# Patient Record
Sex: Female | Born: 1950
Health system: Southern US, Community
[De-identification: ages and names within clinical notes are randomized; demographics above are authoritative.]

## PROBLEM LIST (undated history)

## (undated) DIAGNOSIS — E785 Hyperlipidemia, unspecified: Secondary | ICD-10-CM

## (undated) DIAGNOSIS — M8580 Other specified disorders of bone density and structure, unspecified site: Secondary | ICD-10-CM

## (undated) HISTORY — DX: Other specified disorders of bone density and structure, unspecified site: M85.80

## (undated) HISTORY — PX: PATELLA FRACTURE SURGERY: SHX735

## (undated) HISTORY — PX: OTHER SURGICAL HISTORY: SHX169

## (undated) HISTORY — DX: Hyperlipidemia, unspecified: E78.5

---

## 1998-02-22 ENCOUNTER — Other Ambulatory Visit: Admission: RE | Admit: 1998-02-22 | Discharge: 1998-02-22 | Payer: Self-pay | Admitting: Obstetrics and Gynecology

## 1999-09-02 ENCOUNTER — Other Ambulatory Visit: Admission: RE | Admit: 1999-09-02 | Discharge: 1999-09-02 | Payer: Self-pay | Admitting: Obstetrics and Gynecology

## 2001-01-19 ENCOUNTER — Other Ambulatory Visit: Admission: RE | Admit: 2001-01-19 | Discharge: 2001-01-19 | Payer: Self-pay | Admitting: Obstetrics and Gynecology

## 2002-04-03 ENCOUNTER — Ambulatory Visit (HOSPITAL_COMMUNITY): Admission: RE | Admit: 2002-04-03 | Discharge: 2002-04-03 | Payer: Self-pay | Admitting: Orthopedic Surgery

## 2002-04-03 ENCOUNTER — Encounter: Payer: Self-pay | Admitting: Orthopedic Surgery

## 2002-06-21 ENCOUNTER — Other Ambulatory Visit: Admission: RE | Admit: 2002-06-21 | Discharge: 2002-06-21 | Payer: Self-pay | Admitting: Obstetrics and Gynecology

## 2003-02-28 ENCOUNTER — Encounter: Admission: RE | Admit: 2003-02-28 | Discharge: 2003-02-28 | Payer: Self-pay | Admitting: Sports Medicine

## 2005-05-09 ENCOUNTER — Other Ambulatory Visit: Admission: RE | Admit: 2005-05-09 | Discharge: 2005-05-09 | Payer: Self-pay | Admitting: Obstetrics and Gynecology

## 2007-08-09 ENCOUNTER — Ambulatory Visit: Payer: Self-pay | Admitting: Cardiology

## 2009-01-10 ENCOUNTER — Ambulatory Visit: Payer: Self-pay | Admitting: Family Medicine

## 2009-01-18 ENCOUNTER — Ambulatory Visit: Payer: Self-pay | Admitting: Family Medicine

## 2009-02-19 ENCOUNTER — Ambulatory Visit: Payer: Self-pay | Admitting: Family Medicine

## 2009-02-26 ENCOUNTER — Ambulatory Visit: Payer: Self-pay | Admitting: Family Medicine

## 2010-09-10 NOTE — Assessment & Plan Note (Signed)
[facility]                            CARDIOLOGY OFFICE NOTE   NAME:[name], [name] BULLUCK                        MRN:          [id number]  DATE:08/09/2007                            DOB:          04-23-1951    I was asked by Dr. Arelia Sneddon to consult on [name] [name] with her family  history of coronary artery disease.   HISTORY OF PRESENT ILLNESS:  She is 60 years of age, married and has  three children.  She is extremely health conscious and has been an  active outdoor enthusiast for years.  She enjoys walking, running,  biking, and weight training.  She used to backpack and camp a lot.  She  works out about 8 hours a week.   She has a family history with her father dying of an MI at age 50.  He  was a smoker and also an alcoholic.  Her mother has  hypercholesterolemia, hypertension and had her first coronary event in  her 18s.   She does not smoke, does not use any recreational products.  She drinks  socially but rarely, and her last cholesterol was about 160.   She is totally asymptomatic.   PAST MEDICAL HISTORY:  She has no known drug allergies.   CURRENT MEDICATIONS:  1. Multivitamin.  2. Aspirin 325 mg a day.  3. Xalatan eye drops daily.   PAST SURGICAL HISTORY:  Negative.   FAMILY HISTORY:  Is as above.   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   REVIEW OF SYSTEMS:  Other than some history of mild depression, is  negative.   PHYSICAL EXAMINATION:  GENERAL:  She is a very healthy-appearing white  female in no acute distress.  VITAL SIGNS:  She is 5 feet 3 inches, weighs 128 pounds.  Blood pressure  is 138/80. Her pulse is 55 and regular.  HEENT:  Normocephalic, atraumatic.  PERRLA.  Extraocular movements  intact.  Sclera clear.  Face symmetry is normal.  Dentition  satisfactory.  NECK:  Supple.  Carotids upstrokes equal bilaterally without bruits, no  JVD.  Thyroid is not  enlarged.  Trachea is midline.  LUNGS:  Clear.  HEART:  Reveals a nondisplaced PMI.  She has normal S1-S2 without  murmur, rub or gallop.  ABDOMEN:  Soft, good bowel sounds.  No midline bruit.  EXTREMITIES:  No cyanosis, clubbing or edema.  Pulses are intact.  NEUROLOGIC:  Exam is intact.  SKIN:  Unremarkable.   Electrocardiogram shows sinus bradycardia, otherwise negative.   ASSESSMENT:  [name] [name] is an extremely health-conscious white female  with only one risk factor, and that is advancing age.  She truly does  not have a family history seeing that her father had other risk factors  as did her mother.  Her mother also was in her 54s when she had her  first coronary event.   I have reinforced all her good healthy activities including the  exercise.  She is going to have lipids drawn by Dr. Arelia Sneddon this week.  We went over acceptable values which I think she will certainly fall  into.   I am concerned about her borderline blood pressure.  I checked it again  with a large adult cuff in the left arm before she left.  It was about  136/about 82.   I have advised her to obtain a blood pressure cuff and keep a close  watch on this.  If she is running consistently above 135-140 over 85-90,  she needs pharmacological therapy.  She is already very careful with her  diet, avoids salt, an exercises on a regular basis.  If her blood  pressure remains high, she most likely has essential hypertension and  would need pharmacological therapy.   I gave her our number to call back if this turns out to be the case.  We  would be more than happy to see her back and to implement therapy.     Thomas C. Daleen Squibb, MD, [facility]  Electronically Signed    TCW/MedQ  DD: 08/09/2007  DT: 08/09/2007  Job #: [id number]   cc:   Juluis Mire, M.D.

## 2011-03-06 ENCOUNTER — Encounter: Payer: Self-pay | Admitting: Family Medicine

## 2011-03-10 ENCOUNTER — Encounter: Payer: Self-pay | Admitting: Family Medicine

## 2011-03-10 ENCOUNTER — Institutional Professional Consult (permissible substitution) (INDEPENDENT_AMBULATORY_CARE_PROVIDER_SITE_OTHER): Payer: Managed Care, Other (non HMO) | Admitting: Family Medicine

## 2011-03-10 ENCOUNTER — Ambulatory Visit (INDEPENDENT_AMBULATORY_CARE_PROVIDER_SITE_OTHER): Payer: Managed Care, Other (non HMO) | Admitting: Family Medicine

## 2011-03-10 VITALS — BP 130/84 | HR 62 | Wt 129.0 lb

## 2011-03-10 DIAGNOSIS — E785 Hyperlipidemia, unspecified: Secondary | ICD-10-CM

## 2011-03-10 DIAGNOSIS — Z23 Encounter for immunization: Secondary | ICD-10-CM

## 2011-03-10 DIAGNOSIS — Z8249 Family history of ischemic heart disease and other diseases of the circulatory system: Secondary | ICD-10-CM

## 2011-03-10 MED ORDER — ATORVASTATIN CALCIUM 10 MG PO TABS: 10.0000 mg | ORAL_TABLET | Freq: Every day | ORAL | Status: DC

## 2011-03-10 NOTE — Patient Instructions (Signed)
Come back after you've been on the Lipitor for a couple of months so we can check your numbers again. She had trouble with aches and pains call me

## 2011-03-10 NOTE — Progress Notes (Signed)
  Subjective:    Patient ID: [name] [name], female    DOB: 04/27/1951, 60 y.o.   MRN: [id number]  HPI She is here for consult concerning recent blood work. The blood work was reviewed with her and did show slightly elevated cholesterol as well as LDL however her HDL was above 90. Resting blood work looked normal. She does have a family history of heart disease with a father having an MI at age 5 and mother apparently dying around age 18 from related problems. She apparently did see a cardiologist approximately 4 years ago however no stress testing was done.   Review of Systems     Objective:   Physical Exam Alert and in no distress otherwise not examined       Assessment & Plan:   1. Hyperlipidemia LDL goal < 100   2. Family history of heart disease in female family member before age 17    I discussed heart disease and the risk factors involved in it. Recommend she start on a statin drug due to her positive family. I discussed possible side effects with her. Also discussed returning to cardiologist probably next year. She will return here after she has been on the statin for 2 months. She will probably started in January when she starts a new job. history.

## 2011-03-28 ENCOUNTER — Ambulatory Visit (INDEPENDENT_AMBULATORY_CARE_PROVIDER_SITE_OTHER): Payer: Managed Care, Other (non HMO) | Admitting: Medical

## 2011-03-28 ENCOUNTER — Encounter: Payer: Self-pay | Admitting: Medical

## 2011-03-28 VITALS — BP 130/80 | HR 60 | Temp 98.4°F | Resp 16 | Wt 126.5 lb

## 2011-03-28 DIAGNOSIS — J329 Chronic sinusitis, unspecified: Secondary | ICD-10-CM

## 2011-03-28 MED ORDER — AMOXICILLIN-POT CLAVULANATE 875-125 MG PO TABS: 1.0000 | ORAL_TABLET | Freq: Two times a day (BID) | ORAL | Status: AC

## 2011-03-28 NOTE — Progress Notes (Signed)
Subjective:   HPI  ZI SEK is a 60 y.o. female who presents with 1 week hx/o cough, sinus pressure, coughing up sticky yellow globules of mucous, headache, chest hurting from coughing so much.   She notes low grade fever earlier in the week, some sore throat, post nasal drainage, ear pressure.  Using OTC Mucinex DM and Delsym.  Husband has been sick with similar.  No other aggravating or relieving factors.    No other c/o.  The following portions of the patient's history were reviewed and updated as appropriate: allergies, current medications, past family history, past medical history, past social history, past surgical history and problem list.  Past Medical History  Diagnosis Date  . Glaucoma   . Hyperlipidemia    Review of Systems Constitutional: +fever, -chills, -sweats, -unexpected -weight change,+fatigue ENT: -runny nose, +ear pain, +sore throat Cardiology:  +chest pain, -palpitations, -edema Respiratory: +cough, -shortness of breath, -wheezing Gastroenterology: -abdominal pain, -nausea, -vomiting, -diarrhea, -constipation Hematology: -bleeding or bruising problems Musculoskeletal: -arthralgias, -myalgias, -joint swelling, -back pain Ophthalmology: -vision changes Urology: -dysuria, -difficulty urinating, -hematuria, -urinary frequency, -urgency Neurology: +headache, -weakness, -tingling, -numbness   Objective:   Filed Vitals:   03/28/11 0817  BP: 130/80  Pulse: 60  Temp: 98.4 F (36.9 C)  Resp: 16    General appearance: Alert, WD/WN, no distress                             Skin: warm, no rash                           Head: +frontal sinus tenderness,                            Eyes: conjunctiva normal, corneas clear, PERRLA                            Ears: pearly TMs, external ear canals normal                          Nose: septum midline, turbinates swollen, with erythema and clear discharge             Mouth/throat: MMM, tongue normal, mild pharyngeal  erythema                           Neck: supple, no adenopathy, no thyromegaly, nontender                          Heart: RRR, normal S1, S2, no murmurs                         Lungs: CTA bilaterally, no wheezes, rales, or rhonchi      Assessment and Plan:   Encounter Diagnosis  Name Primary?  . Sinusitis Yes    Prescription given for Augmentin.  Can use OTC Mucinex DM for congestion.  Tylenol or Ibuprofen OTC for fever and malaise.  Discussed symptomatic relief, nasal saline, and call or return if worse or not improving in 2-3 days.

## 2011-03-28 NOTE — Patient Instructions (Signed)

## 2013-03-10 ENCOUNTER — Ambulatory Visit (INDEPENDENT_AMBULATORY_CARE_PROVIDER_SITE_OTHER): Payer: 59 | Admitting: Family Medicine

## 2013-03-10 ENCOUNTER — Encounter: Payer: Self-pay | Admitting: Family Medicine

## 2013-03-10 VITALS — BP 120/80 | HR 60 | Resp 16 | Wt 125.0 lb

## 2013-03-10 DIAGNOSIS — Z23 Encounter for immunization: Secondary | ICD-10-CM

## 2013-03-10 DIAGNOSIS — K219 Gastro-esophageal reflux disease without esophagitis: Secondary | ICD-10-CM

## 2013-03-10 NOTE — Progress Notes (Signed)
Teaching Physician: Sharlot Gowda, MD Dictated By: Judithann Graves  Subjective:  [name] [name] is a 62 y.o. female who presents for evaluation of 2 weeks of increasing epigastric burning pain and early satiety. She notices no particular foods that incite her epigastric pain, noting only that she feels its onset nearly immediately after eating. She has not been able to eat the same quantity of food that she usually does with meals. She is unaware of any associated weight loss. She has tried over the counter antacids and they have brought some relief. She has no cough and the epigastric pain is not worsened by lying down or consuming a meal near bedtime. She does not have dysphagia and she has had no melena, no nausea, and no emesis.  She has a past history of ulcer disease  ROS as in subjective.  PMH: Past Medical History  Diagnosis Date  . Glaucoma   . Hyperlipidemia    PSH: No past surgical history on file.  Medications: Prior to Admission medications   Medication Sig Start Date End Date Taking? Authorizing Provider  latanoprost (XALATAN) 0.005 % ophthalmic solution 1 drop at bedtime.     Yes Historical Provider, MD  atorvastatin (LIPITOR) 10 MG tablet Take 1 tablet (10 mg total) by mouth daily. 03/10/11 03/09/12  Ronnald Nian, MD    Objective: alert and in no distress. Tympanic membranes and canals are normal. Throat is clear. Tonsils are normal. Neck is supple without adenopathy or thyromegaly. Cardiac exam shows a regular sinus rhythm without murmurs or gallops. Lungs are clear to auscultation. Abdominal exam shows no hepatosplenomegaly, masses or tenderness.  Filed Vitals:   03/10/13 0859  BP: 120/80  Pulse: 60  Resp: 16    Physical Exam:  BP 120/80  Pulse 60  Resp 16  Wt 125 lb (56.7 kg)   Assessment and Plan:  1. GERD (gastroesophageal reflux disease): Recommend trial of over the counter omeprazole 40 mg for 1 week. RTC if therapeutic failure and will consider  alternative proton pump inhibitor vs. Gastroenterology referral for evaluation of PPI-refractory GERD. She will also be referred to gastroenterology for routine colonoscopy  2. Need for prophylactic vaccination and inoculation against unspecified single disease: Given prescription for Zostavax.   Dr. Susann Givens was present for the encounter and agrees with the above assessment and plan.

## 2013-03-10 NOTE — Patient Instructions (Signed)
Gastroesophageal Reflux Disease, Adult Gastroesophageal reflux disease (GERD) happens when acid from your stomach flows up into the esophagus. When acid comes in contact with the esophagus, the acid causes soreness (inflammation) in the esophagus. Over time, GERD may create small holes (ulcers) in the lining of the esophagus. CAUSES   Increased body weight. This puts pressure on the stomach, making acid rise from the stomach into the esophagus.  Smoking. This increases acid production in the stomach.  Drinking alcohol. This causes decreased pressure in the lower esophageal sphincter (valve or ring of muscle between the esophagus and stomach), allowing acid from the stomach into the esophagus.  Late evening meals and a full stomach. This increases pressure and acid production in the stomach.  A malformed lower esophageal sphincter. Sometimes, no cause is found. SYMPTOMS   Burning pain in the lower part of the mid-chest behind the breastbone and in the mid-stomach area. This may occur twice a week or more often.  Trouble swallowing.  Sore throat.  Dry cough.  Asthma-like symptoms including chest tightness, shortness of breath, or wheezing. DIAGNOSIS  Your caregiver may be able to diagnose GERD based on your symptoms. In some cases, X-rays and other tests may be done to check for complications or to check the condition of your stomach and esophagus. TREATMENT  Your caregiver may recommend over-the-counter or prescription medicines to help decrease acid production. Ask your caregiver before starting or adding any new medicines.  HOME CARE INSTRUCTIONS   Change the factors that you can control. Ask your caregiver for guidance concerning weight loss, quitting smoking, and alcohol consumption.  Avoid foods and drinks that make your symptoms worse, such as:  Caffeine or alcoholic drinks.  Chocolate.  Peppermint or mint flavorings.  Garlic and onions.  Spicy foods.  Citrus fruits,  such as oranges, lemons, or limes.  Tomato-based foods such as sauce, chili, salsa, and pizza.  Fried and fatty foods.  Avoid lying down for the 3 hours prior to your bedtime or prior to taking a nap.  Eat small, frequent meals instead of large meals.  Wear loose-fitting clothing. Do not wear anything tight around your waist that causes pressure on your stomach.  Raise the head of your bed 6 to 8 inches with wood blocks to help you sleep. Extra pillows will not help.  Only take over-the-counter or prescription medicines for pain, discomfort, or fever as directed by your caregiver.  Do not take aspirin, ibuprofen, or other nonsteroidal anti-inflammatory drugs (NSAIDs). SEEK IMMEDIATE MEDICAL CARE IF:   You have pain in your arms, neck, jaw, teeth, or back.  Your pain increases or changes in intensity or duration.  You develop nausea, vomiting, or sweating (diaphoresis).  You develop shortness of breath, or you faint.  Your vomit is green, yellow, black, or looks like coffee grounds or blood.  Your stool is red, bloody, or black. These symptoms could be signs of other problems, such as heart disease, gastric bleeding, or esophageal bleeding. MAKE SURE YOU:   Understand these instructions.  Will watch your condition.  Will get help right away if you are not doing well or get worse. Document Released: 01/22/2005 Document Revised: 07/07/2011 Document Reviewed: 11/01/2010 [facility] Patient Information 2014 [place], [place]. Take 2 Prilosec daily for the next week or so. If your symptoms go away then use it as needed but they don't: Ongoing need to do more work

## 2013-04-29 ENCOUNTER — Encounter: Payer: Self-pay | Admitting: Medical

## 2013-04-29 ENCOUNTER — Ambulatory Visit (INDEPENDENT_AMBULATORY_CARE_PROVIDER_SITE_OTHER): Payer: 59 | Admitting: Medical

## 2013-04-29 VITALS — BP 130/80 | HR 66 | Temp 98.9°F | Resp 16 | Wt 128.0 lb

## 2013-04-29 DIAGNOSIS — R05 Cough: Secondary | ICD-10-CM

## 2013-04-29 DIAGNOSIS — J101 Influenza due to other identified influenza virus with other respiratory manifestations: Secondary | ICD-10-CM

## 2013-04-29 DIAGNOSIS — R059 Cough, unspecified: Secondary | ICD-10-CM

## 2013-04-29 DIAGNOSIS — J111 Influenza due to unidentified influenza virus with other respiratory manifestations: Secondary | ICD-10-CM

## 2013-04-29 DIAGNOSIS — R51 Headache: Secondary | ICD-10-CM

## 2013-04-29 DIAGNOSIS — R509 Fever, unspecified: Secondary | ICD-10-CM

## 2013-04-29 LAB — POC INFLUENZA A&B (BINAX/QUICKVUE)
Influenza A, POC: POSITIVE
Influenza B, POC: NEGATIVE

## 2013-04-29 MED ORDER — OSELTAMIVIR PHOSPHATE 75 MG PO CAPS: 75.0000 mg | ORAL_CAPSULE | Freq: Two times a day (BID) | ORAL | Status: DC

## 2013-04-29 NOTE — Progress Notes (Signed)
Subjective: Here for "I'm sick."  Symptoms begin yesterday morning with horrible headache, chest aches, ears hurt, fatigue, had fever yesterday.   She notes ear pain, some sore throat, sinus pain.  +body aches, chills.  Feels a little SOB.  Denies rash, no NVD.  No sick contacts.   Using mucinex DM.   Water intake is good.   Past Medical History  Diagnosis Date  . Glaucoma   . Hyperlipidemia    ROS as in subjective  Objective: General: Well-developed well nourished, no acute distress, somewhat ill appearing Skin: Warm to hot, dry HEENT: Frontal sinus mild tenderness, TMs flat, but no erythema, nares with slight congestion and erythema conjunctiva normal Neck: Supple, no lymphadenopathy, nontender, no mass Heart: RRR, normal S1, S2, no murmurs Lungs: Somewhat bronchial sounding, otherwise no rhonchi, wheezes, rales  Assessment: Encounter Diagnoses  Name Primary?  . Headache(784.0) Yes  . Fever, unspecified   . Cough    Plan: +influenza A.  Discussed diagnosis of influenza.  prescription given for Tamiflu, discussed risks/benefits of medication.  Discussed supportive care including rest, hydration, OTC Tylenol or NSAID for fever, aches, and malaise.  Discussed period of contagion, self quarantine at home away from others to avoid spread of disease, discussed means of transmission, and possible complications including pneumonia.  If worse or not improving within the next 4-5 days, then call or return.  Gave note for work.

## 2013-04-29 NOTE — Patient Instructions (Signed)
Influenza, Adult Influenza ("the flu") is a viral infection of the respiratory tract. It causes chills, fever, cough, headache, body aches, and sore throat. Influenza in general will make you feel sicker than when you have a common cold. Symptoms of the illness typically last a few days. Cough and fatigue may continue for as long as 7 to 10 days. Influenza is highly contagious. It spreads easily to others in the droplets from coughs and sneezes. People frequently become infected by touching something that was recently contaminated with the virus and then touch their mouth, nose or eyes. This infection is caused by a virus. Symptoms will not be reduced or improved by taking an antibiotic. Antibiotics are medications that kill bacteria, not viruses. DIAGNOSIS  Diagnosis of influenza is often made based on the history and physical examination as well as the presence of influenza reports occurring in your community. Testing can be done if the diagnosis is not certain. TREATMENT  Since influenza is caused by a virus, antibiotics are not helpful. Your caregiver may prescribe antiviral medicines to shorten the illness and lessen the severity. Your caregiver may also recommend influenza vaccination and/or antiviral medicines for your family members in order to prevent the spread of influenza to them. HOME CARE INSTRUCTIONS  DO NOT GIVE ASPIRIN TO PERSONS WITH INFLUENZA WHO ARE UNDER 18 YEARS OF AGE. This could lead to brain and liver damage (Reye's syndrome). Read the label on over-the-counter medicines.   Stay home from work or school if at all possible until most of your symptoms are gone.   Only take over-the-counter or prescription medicines for pain, discomfort, or fever as directed by your caregiver.   Use a cool mist humidifier to increase air moisture. This will make breathing easier.   Rest until your temperature is nearly normal: 98.6 F (37 C). This usually takes 3 to 4 days. Be sure you get  plenty of rest.   Drink at least eight, eight-ounce glasses of fluids per day. Fluids include water, juice, broth, gelatin, or lemonade.   Cover your mouth and nose when coughing or sneezing and wash your hands often to prevent the spread of this virus to other persons.  PREVENTION  Annual influenza vaccination (flu shots) is the best way to avoid getting influenza. An annual flu shot is now routinely recommended for all adults in the [place] SEEK MEDICAL CARE IF:   You develop shortness of breath while resting.   You have a deep cough with production of mucous or chest pain.   You develop nausea (feeling sick to your stomach), vomiting, or diarrhea.  SEEK IMMEDIATE MEDICAL CARE IF:   You have difficulty breathing, become short of breath, or your skin or nails turn bluish.   You develop severe neck pain or stiffness.   You develop a severe headache, facial pain, or earache.   You have a fever.   You develop nausea or vomiting that cannot be controlled.  Document Released: 04/11/2000 Document Revised: 12/25/2010 Document Reviewed: 02/14/2009 [employer] Patient Information 2012 [employer]. 

## 2013-07-01 LAB — HM COLONOSCOPY

## 2013-07-28 ENCOUNTER — Encounter: Payer: Self-pay | Admitting: Medical

## 2013-07-28 ENCOUNTER — Ambulatory Visit (INDEPENDENT_AMBULATORY_CARE_PROVIDER_SITE_OTHER): Payer: 59 | Admitting: Medical

## 2013-07-28 VITALS — BP 106/60 | HR 64 | Temp 98.3°F | Resp 18 | Wt 126.0 lb

## 2013-07-28 DIAGNOSIS — J3489 Other specified disorders of nose and nasal sinuses: Secondary | ICD-10-CM

## 2013-07-28 DIAGNOSIS — R05 Cough: Secondary | ICD-10-CM

## 2013-07-28 DIAGNOSIS — R059 Cough, unspecified: Secondary | ICD-10-CM

## 2013-07-28 DIAGNOSIS — R51 Headache: Secondary | ICD-10-CM

## 2013-07-28 MED ORDER — AMOXICILLIN 875 MG PO TABS: 875.0000 mg | ORAL_TABLET | Freq: Two times a day (BID) | ORAL | Status: DC

## 2013-07-28 MED ORDER — HYDROCODONE-HOMATROPINE 5-1.5 MG/5ML PO SYRP: 5.0000 mL | ORAL_SOLUTION | Freq: Three times a day (TID) | ORAL | Status: DC | PRN

## 2013-07-28 MED ORDER — ALBUTEROL SULFATE HFA 108 (90 BASE) MCG/ACT IN AERS: 2.0000 | INHALATION_SPRAY | Freq: Four times a day (QID) | RESPIRATORY_TRACT | Status: DC | PRN

## 2013-07-28 NOTE — Patient Instructions (Signed)
  Thank you for giving me the opportunity to serve you today.    Your diagnosis today includes: Encounter Diagnoses  Name Primary?  . Cough Yes  . Sinus pressure   . Headache(784.0)      Specific recommendations today include:  Begin Amoxicillin antibiotic  increase water intake  Rest  Use the Albuterol inhaler, 2 puffs every 4-6 hours if needed  Use the Hycodan cough syrup for worse cough as needed, but caution, this can cause drowsiness  You can use the Mucinex DM in the day and Nyquil at night.    Return if symptoms worsen or fail to improve.

## 2013-07-28 NOTE — Progress Notes (Signed)
Subjective:  [name] [name] is a 63 y.o. female who presents for 5 day hx/o illness.  Symptoms include cough, headache, chest congestion, hacking cough, burning in chest, fatigue, weak eyes.   Has felt feverish at times.  At times body aches, chills.   +sinus pressure.  Denies NVD, no abdominal or back pain.  No GU symptoms.  No sore throat, no ear pain.  Husband was treated last week for illness similarly, was put on antibiotic.  She thinks she picked it up from husband.   Using MucinexDM, Nyquil at night.  Used husband's stronger cough syrup last night.   No other aggravating or relieving factors.  No other c/o.  The following portions of the patient's history were reviewed and updated as appropriate: allergies, current medications, past family history, past medical history, past social history, past surgical history and problem list.  ROS as in subjective  Past Medical History  Diagnosis Date  . Glaucoma   . Hyperlipidemia      Objective: BP 106/60  Pulse 64  Temp(Src) 98.3 F (36.8 C) (Oral)  Resp 18  Wt 126 lb (57.153 kg)  General appearance: Alert, WD/WN, no distress, ill appearing                             Skin: warm, no rash, no diaphoresis                           Head: no sinus tenderness                            Eyes: conjunctiva normal, corneas clear, PERRLA                            Ears: pearly TMs, external ear canals normal                          Nose: septum midline, turbinates swollen, with erythema and mucoid discharge             Mouth/throat: MMM, tongue normal, mild pharyngeal erythema                           Neck: supple, no adenopathy, no thyromegaly, nontender                          Heart: RRR, normal S1, S2, no murmurs                         Lungs: +bronchial breath sounds,  Decreased breath sounds, - rhonchi, no wheezes, no rales                Extremities: no edema, nontender     Assessment: Encounter Diagnoses  Name Primary?  . Cough Yes   . Sinus pressure   . Headache(784.0)       Plan:  Medication orders today include: amoxicilin, hycodan syrup, rest, suggested symptomatic OTC remedies for cough and congestion.  Tylenol or Ibuprofen OTC for fever and malaise.  Call/return in 2-3 days if symptoms are worse or not improving.    Return soon for physical

## 2013-08-03 ENCOUNTER — Encounter: Payer: Self-pay | Admitting: Medical

## 2013-08-03 ENCOUNTER — Encounter: Payer: Self-pay | Admitting: Internal Medicine

## 2014-06-20 ENCOUNTER — Ambulatory Visit (INDEPENDENT_AMBULATORY_CARE_PROVIDER_SITE_OTHER): Payer: 59 | Admitting: Family Medicine

## 2014-06-20 ENCOUNTER — Encounter: Payer: Self-pay | Admitting: Family Medicine

## 2014-06-20 VITALS — BP 116/78 | HR 115 | Temp 97.6°F | Wt 127.0 lb

## 2014-06-20 DIAGNOSIS — J0100 Acute maxillary sinusitis, unspecified: Secondary | ICD-10-CM

## 2014-06-20 MED ORDER — AMOXICILLIN 875 MG PO TABS: 875.0000 mg | ORAL_TABLET | Freq: Two times a day (BID) | ORAL | Status: DC

## 2014-06-20 NOTE — Progress Notes (Signed)
   Subjective:    Patient ID: Estanislado EmmsKathi K Newhart, female    DOB: 10/17/1950, 64 y.o.   MRN: [id number]  HPI Approximately 2 weeks ago she had a crown put on one of her upper left teeth and after that noted some left maxillary and lower eyelid swelling. This has gotten slightly better. She also is had difficulty over the last week or so with headache, sinus pressure, congestion, slight upper tooth discomfort on the left. No sore throat, fever, chills. She has been using Sudafed. She does not smoke.   Review of Systems     Objective:   Physical Exam Alert and in no distress. Nasal mucosa is normal with tenderness over frontal and maxillary sinuses. membranes and canals are normal. Pharyngeal area is normal. Neck is supple without adenopathy or thyromegaly. Cardiac exam shows a regular sinus rhythm without murmurs or gallops. Lungs are clear to auscultation.        Assessment & Plan:  Acute maxillary sinusitis, recurrence not specified - Plan: amoxicillin (AMOXIL) 875 MG tablet  she will call if not entirely better when she finishes antibiotic.

## 2014-10-30 ENCOUNTER — Other Ambulatory Visit: Payer: Self-pay | Admitting: Family Medicine

## 2014-10-30 MED ORDER — ERYTHROMYCIN 5 MG/GM OP OINT: 1.0000 "application " | TOPICAL_OINTMENT | Freq: Three times a day (TID) | OPHTHALMIC | Status: DC

## 2014-10-31 ENCOUNTER — Ambulatory Visit (INDEPENDENT_AMBULATORY_CARE_PROVIDER_SITE_OTHER): Payer: 59 | Admitting: Family Medicine

## 2014-10-31 VITALS — BP 120/70 | HR 57

## 2014-10-31 DIAGNOSIS — H01003 Unspecified blepharitis right eye, unspecified eyelid: Secondary | ICD-10-CM | POA: Diagnosis not present

## 2014-10-31 DIAGNOSIS — H01006 Unspecified blepharitis left eye, unspecified eyelid: Secondary | ICD-10-CM

## 2014-10-31 NOTE — Progress Notes (Signed)
   Subjective:    Patient ID: [name] [name], female    DOB: 03-02-1951, 64 y.o.   MRN: [id number]  HPI He complains of redness and slight drainage from the left eye and to a lesser extent the right. He was started on erythromycin ophthalmic limit yesterday.   Review of Systems     Objective:   Physical Exam Slight edema is noted in both lower eyelids left greater than right as well as some erythema in the right upper eyelid along the eyelashes. Cornea and conjunctiva normal.       Assessment & Plan:  Blepharitis of both eyes Continue with the erythromycin ophthalmic ointment. Also I hygiene with gentle cleansing and warm compresses.

## 2014-10-31 NOTE — Patient Instructions (Signed)
Gentle washing of the eyelids and warm soaks a couple times a day

## 2015-05-04 ENCOUNTER — Ambulatory Visit (INDEPENDENT_AMBULATORY_CARE_PROVIDER_SITE_OTHER): Payer: 59 | Admitting: Family Medicine

## 2015-05-04 ENCOUNTER — Encounter: Payer: Self-pay | Admitting: Family Medicine

## 2015-05-04 VITALS — BP 158/90 | HR 50 | Ht 62.0 in | Wt 126.6 lb

## 2015-05-04 DIAGNOSIS — Z86010 Personal history of colonic polyps: Secondary | ICD-10-CM

## 2015-05-04 DIAGNOSIS — M8580 Other specified disorders of bone density and structure, unspecified site: Secondary | ICD-10-CM | POA: Diagnosis not present

## 2015-05-04 DIAGNOSIS — G4700 Insomnia, unspecified: Secondary | ICD-10-CM

## 2015-05-04 DIAGNOSIS — E785 Hyperlipidemia, unspecified: Secondary | ICD-10-CM | POA: Diagnosis not present

## 2015-05-04 DIAGNOSIS — Z1159 Encounter for screening for other viral diseases: Secondary | ICD-10-CM | POA: Diagnosis not present

## 2015-05-04 DIAGNOSIS — Z23 Encounter for immunization: Secondary | ICD-10-CM | POA: Diagnosis not present

## 2015-05-04 DIAGNOSIS — Z0000 Encounter for general adult medical examination without abnormal findings: Secondary | ICD-10-CM | POA: Diagnosis not present

## 2015-05-04 DIAGNOSIS — Z8249 Family history of ischemic heart disease and other diseases of the circulatory system: Secondary | ICD-10-CM | POA: Diagnosis not present

## 2015-05-04 DIAGNOSIS — IMO0001 Reserved for inherently not codable concepts without codable children: Secondary | ICD-10-CM

## 2015-05-04 DIAGNOSIS — R030 Elevated blood-pressure reading, without diagnosis of hypertension: Secondary | ICD-10-CM | POA: Diagnosis not present

## 2015-05-04 LAB — LIPID PANEL
Cholesterol: 210 mg/dL — ABNORMAL HIGH (ref 125–200)
HDL: 76 mg/dL (ref >=46)
LDL CALC: 119 mg/dL (ref <130)
Total CHOL/HDL Ratio: 2.8 Ratio (ref <=5.0)
Triglycerides: 76 mg/dL (ref <150)
VLDL: 15 mg/dL (ref <30)

## 2015-05-04 LAB — COMPREHENSIVE METABOLIC PANEL
ALK PHOS: 76 U/L (ref 33–130)
ALT: 14 U/L (ref 6–29)
AST: 19 U/L (ref 10–35)
Albumin: 4.5 g/dL (ref 3.6–5.1)
BILIRUBIN TOTAL: 0.6 mg/dL (ref 0.2–1.2)
BUN: 13 mg/dL (ref 7–25)
CO2: 26 mmol/L (ref 20–31)
CREATININE: 0.69 mg/dL (ref 0.50–0.99)
Calcium: 9.4 mg/dL (ref 8.6–10.4)
Chloride: 104 mmol/L (ref 98–110)
GLUCOSE: 83 mg/dL (ref 65–99)
Potassium: 4.4 mmol/L (ref 3.5–5.3)
SODIUM: 139 mmol/L (ref 135–146)
Total Protein: 6.8 g/dL (ref 6.1–8.1)

## 2015-05-04 LAB — CBC WITH DIFFERENTIAL/PLATELET
BASOS ABS: 0 10*3/uL (ref 0.0–0.1)
BASOS PCT: 1 % (ref 0–1)
EOS ABS: 0 10*3/uL (ref 0.0–0.7)
EOS PCT: 1 % (ref 0–5)
HCT: 38.8 % (ref 36.0–46.0)
Hemoglobin: 13 g/dL (ref 12.0–15.0)
Lymphocytes Relative: 26 % (ref 12–46)
Lymphs Abs: 1.2 10*3/uL (ref 0.7–4.0)
MCH: 29.4 pg (ref 26.0–34.0)
MCHC: 33.5 g/dL (ref 30.0–36.0)
MCV: 87.8 fL (ref 78.0–100.0)
MPV: 10.9 fL (ref 8.6–12.4)
Monocytes Absolute: 0.6 10*3/uL (ref 0.1–1.0)
Monocytes Relative: 13 % — ABNORMAL HIGH (ref 3–12)
Neutro Abs: 2.7 10*3/uL (ref 1.7–7.7)
Neutrophils Relative %: 59 % (ref 43–77)
PLATELETS: 211 10*3/uL (ref 150–400)
RBC: 4.42 MIL/uL (ref 3.87–5.11)
RDW: 14 % (ref 11.5–15.5)
WBC: 4.6 10*3/uL (ref 4.0–10.5)

## 2015-05-04 NOTE — Patient Instructions (Addendum)
Come back in a month and we'll check your pressure again Insomnia Insomnia is a sleep disorder that makes it difficult to fall asleep or to stay asleep. Insomnia can cause tiredness (fatigue), low energy, difficulty concentrating, mood swings, and poor performance at work or school.  There are three different ways to classify insomnia:  Difficulty falling asleep.  Difficulty staying asleep.  Waking up too early in the morning. Any type of insomnia can be long-term (chronic) or short-term (acute). Both are common. Short-term insomnia usually lasts for three months or less. Chronic insomnia occurs at least three times a week for longer than three months. CAUSES  Insomnia may be caused by another condition, situation, or substance, such as:  Anxiety.  Certain medicines.  Gastroesophageal reflux disease (GERD) or other gastrointestinal conditions.  Asthma or other breathing conditions.  Restless legs syndrome, sleep apnea, or other sleep disorders.  Chronic pain.  Menopause. This may include hot flashes.  Stroke.  Abuse of alcohol, tobacco, or illegal drugs.  Depression.  Caffeine.   Neurological disorders, such as Alzheimer disease.  An overactive thyroid (hyperthyroidism). The cause of insomnia may not be known. RISK FACTORS Risk factors for insomnia include:  Gender. Women are more commonly affected than men.  Age. Insomnia is more common as you get older.  Stress. This may involve your professional or personal life.  Income. Insomnia is more common in people with lower income.  Lack of exercise.   Irregular work schedule or night shifts.  Traveling between different time zones. SIGNS AND SYMPTOMS If you have insomnia, trouble falling asleep or trouble staying asleep is the main symptom. This may lead to other symptoms, such as:  Feeling fatigued.  Feeling nervous about going to sleep.  Not feeling rested in the morning.  Having trouble  concentrating.  Feeling irritable, anxious, or depressed. TREATMENT  Treatment for insomnia depends on the cause. If your insomnia is caused by an underlying condition, treatment will focus on addressing the condition. Treatment may also include:   Medicines to help you sleep.  Counseling or therapy.  Lifestyle adjustments. HOME CARE INSTRUCTIONS   Take medicines only as directed by your health care provider.  Keep regular sleeping and waking hours. Avoid naps.  Keep a sleep diary to help you and your health care provider figure out what could be causing your insomnia. Include:   When you sleep.  When you wake up during the night.  How well you sleep.   How rested you feel the next day.  Any side effects of medicines you are taking.  What you eat and drink.   Make your bedroom a comfortable place where it is easy to fall asleep:  Put up shades or special blackout curtains to block light from outside.  Use a white noise machine to block noise.  Keep the temperature cool.   Exercise regularly as directed by your health care provider. Avoid exercising right before bedtime.  Use relaxation techniques to manage stress. Ask your health care provider to suggest some techniques that may work well for you. These may include:  Breathing exercises.  Routines to release muscle tension.  Visualizing peaceful scenes.  Cut back on alcohol, caffeinated beverages, and cigarettes, especially close to bedtime. These can disrupt your sleep.  Do not overeat or eat spicy foods right before bedtime. This can lead to digestive discomfort that can make it hard for you to sleep.  Limit screen use before bedtime. This includes:  Watching TV.  Using  your smartphone, tablet, and computer.  Stick to a routine. This can help you fall asleep faster. Try to do a quiet activity, brush your teeth, and go to bed at the same time each night.  Get out of bed if you are still awake after 15  minutes of trying to sleep. Keep the lights down, but try reading or doing a quiet activity. When you feel sleepy, go back to bed.  Make sure that you drive carefully. Avoid driving if you feel very sleepy.  Keep all follow-up appointments as directed by your health care provider. This is important. SEEK MEDICAL CARE IF:   You are tired throughout the day or have trouble in your daily routine due to sleepiness.  You continue to have sleep problems or your sleep problems get worse. SEEK IMMEDIATE MEDICAL CARE IF:   You have serious thoughts about hurting yourself or someone else.   This information is not intended to replace advice given to you by your health care provider. Make sure you discuss any questions you have with your health care provider.   Document Released: 04/11/2000 Document Revised: 01/03/2015 Document Reviewed: 01/13/2014 Elsevier Interactive Patient Education [employer].

## 2015-05-04 NOTE — Progress Notes (Signed)
Subjective:    Patient ID: [name] EmmsKathi K Matin, female    DOB: [birth year], 65 y.o.   MRN: [id number]  HPI She is here for complete examination. She complains initially of sleep problems. She can go to bed fairly easily but tends to wake up after 3 or 4 hours. She states that there is nothing new going on her life. As has been going on for approximately one year. She has tried melatonin and did take Ambien for very short period of time. This was given to her by her gynecologist. She exercises regularly, does not use decongestants or a lot of caffeine. Alcohol consumption is minimal. No major stresses in her life. She gets regular gynecologic evaluation. Her gynecologist has diagnosed osteopenia. She also is had colonoscopy showing polyps. She has a positive family history for heart disease and also evidence of hyperlipidemia. She has no other concerns or complaints. Family and social history as well as health maintenance and immunizations were reviewed. She does exercise regularly. Her marriage is going quite well.   Review of Systems  All other systems reviewed and are negative.      Objective:   Physical Exam BP 158/90 mmHg  Pulse 50  Ht 5\' 2"  (1.575 m)  Wt 126 lb 9.6 oz (57.425 kg)  BMI 23.15 kg/m2  SpO2 93%  General Appearance:    Alert, cooperative, no distress, appears stated age  Head:    Normocephalic, without obvious abnormality, atraumatic  Eyes:    PERRL, conjunctiva/corneas clear, EOM's intact, fundi    benign  Ears:    Normal TM's and external ear canals  Nose:   Nares normal, mucosa normal, no drainage or sinus   tenderness  Throat:   Lips, mucosa, and tongue normal; teeth and gums normal  Neck:   Supple, no lymphadenopathy;  thyroid:  no   enlargement/tenderness/nodules; no carotid   bruit or JVD  Back:    Spine nontender, no curvature, ROM normal, no CVA     tenderness  Lungs:     Clear to auscultation bilaterally without wheezes, rales or     ronchi; respirations unlabored   Chest Wall:    No tenderness or deformity   Heart:    Regular rate and rhythm, S1 and S2 normal, no murmur, rub   or gallop  Breast Exam:    Deferred to GYN  Abdomen:     Soft, non-tender, nondistended, normoactive bowel sounds,    no masses, no hepatosplenomegaly  Genitalia:    Deferred to GYN     Extremities:   No clubbing, cyanosis or edema  Pulses:   2+ and symmetric all extremities  Skin:   Skin color, texture, turgor normal, no rashes or lesions  Lymph nodes:   Cervical, supraclavicular, and axillary nodes normal  Neurologic:   CNII-XII intact, normal strength, sensation and gait; reflexes 2+ and symmetric throughout          Psych:   Normal mood, affect, hygiene and grooming.          Assessment & Plan:  Routine general medical examination at a health care facility - Plan: CBC with Differential/Platelet, Comprehensive metabolic panel, Lipid panel  Family history of heart disease in female family member before age 63 - Plan: CBC with Differential/Platelet, Comprehensive metabolic panel, Lipid panel  Hyperlipidemia with target LDL less than 100 - Plan: Lipid panel  Osteopenia  History of colonic polyps  Need for prophylactic vaccination with combined diphtheria-tetanus-pertussis (DTP) vaccine - Plan: Tdap vaccine  greater than or equal to 7yo IM  Need for shingles vaccine - Plan: Varicella-zoster vaccine subcutaneous  Need for hepatitis C screening test - Plan: Hepatitis C antibody  Insomnia  Elevated blood pressure  urged her to keep up her exercise. I will recheck her blood pressure with her next visit. Discussed sleep issues with her in detail. Information was given. Also explained that I will give her Ambien for short-term use if she so desires. Did recommend using melatonin on a regular basis. Also recommend use of vitamin D and calcium for osteopenia.

## 2015-05-05 LAB — HEPATITIS C ANTIBODY: HCV Ab: NEGATIVE

## 2015-06-01 ENCOUNTER — Other Ambulatory Visit: Payer: 59

## 2015-09-26 ENCOUNTER — Ambulatory Visit (INDEPENDENT_AMBULATORY_CARE_PROVIDER_SITE_OTHER): Payer: 59 | Admitting: Family Medicine

## 2015-09-26 ENCOUNTER — Encounter: Payer: Self-pay | Admitting: Family Medicine

## 2015-09-26 VITALS — BP 118/70 | HR 68 | Temp 100.6°F | Ht 62.0 in | Wt 125.0 lb

## 2015-09-26 DIAGNOSIS — R51 Headache: Secondary | ICD-10-CM | POA: Diagnosis not present

## 2015-09-26 DIAGNOSIS — R509 Fever, unspecified: Secondary | ICD-10-CM

## 2015-09-26 DIAGNOSIS — R5383 Other fatigue: Secondary | ICD-10-CM | POA: Diagnosis not present

## 2015-09-26 DIAGNOSIS — R519 Headache, unspecified: Secondary | ICD-10-CM

## 2015-09-26 LAB — CBC WITH DIFFERENTIAL/PLATELET
BASOS ABS: 0 {cells}/uL (ref 0–200)
Basophils Relative: 0 %
EOS PCT: 0 %
Eosinophils Absolute: 0 cells/uL — ABNORMAL LOW (ref 15–500)
HCT: 38.1 % (ref 35.0–45.0)
HEMOGLOBIN: 12.8 g/dL (ref 11.7–15.5)
Lymphocytes Relative: 11 %
Lymphs Abs: 627 cells/uL — ABNORMAL LOW (ref 850–3900)
MCH: 29.3 pg (ref 27.0–33.0)
MCHC: 33.6 g/dL (ref 32.0–36.0)
MCV: 87.2 fL (ref 80.0–100.0)
MONOS PCT: 16 %
MPV: 10.4 fL (ref 7.5–12.5)
Monocytes Absolute: 912 cells/uL (ref 200–950)
NEUTROS PCT: 73 %
Neutro Abs: 4161 cells/uL (ref 1500–7800)
PLATELETS: 192 10*3/uL (ref 140–400)
RBC: 4.37 MIL/uL (ref 3.80–5.10)
RDW: 13.3 % (ref 11.0–15.0)
WBC: 5.7 10*3/uL (ref 4.0–10.5)

## 2015-09-26 MED ORDER — KETOROLAC TROMETHAMINE 60 MG/2ML IM SOLN
60.0000 mg | Freq: Once | INTRAMUSCULAR | Status: AC
  Administered 2015-09-26: 60 mg via INTRAMUSCULAR

## 2015-09-26 MED ORDER — DOXYCYCLINE HYCLATE 100 MG PO TABS: 100.0000 mg | ORAL_TABLET | Freq: Two times a day (BID) | ORAL | Status: DC

## 2015-09-26 NOTE — Progress Notes (Signed)
Chief Complaint  Patient presents with  . Headache    excruciating headache since this past Saturday, sleeping like 85% of the day-so exhausted, Sunday had a huge wave of nausea, walked over to the sink and believes she fainted. Was taking IBU for HA but switched to sinutab and a netti pot.    5 nights ago she felt a little fatigued.  4 days ago she had been working in the yard, took a bath, and then felt very fatigued and had a throbbing headache.  Went to bed without dinner, never got out of bed until the morning.  She still had the throbbing headache when she woke up on Sunday.  Slept off/on most of the day Sunday and Monday, along with headache. She went to work yesterday, but left early (didn't make it to lunch) due to headache and fatigue.  She felt nauseated 2 days ago, while drinking a carbonated flavored water, got up to go to the sink (thought she might vomit), and thinks she fainted.  Denies any injury related to syncopal episode.  She checked for ticks and didn't find any.  2-3 weeks ago she had a rash on the back of her left leg.  Thought it was a spider bite--"it was big and nasty".  It was very itchy.  No purulent drainage.  No known sick contacts.  Denies URI symptoms. Headache is frontal, behind her eyes, and the top of her head.  Used Neti-pot, but drainage was clear, and had no improvement.  Sinus medication also did not help. Ibuprofen 286m wasn't effective (takiing it every 4 hours for 2 days), switched to sinutab when it wasn't working, but that also did not help.    She has some intermittent vertigo--staggers as walking, or just feeling like her equilibrium is off (no true room-spinning).  Yesterday and today, not over the weekend.  [place], no injuries.  Denies feeling this now, just slightly earlier this morning.  Nothing tastes right, things taste metalicky.  Decreased appetite due to the abnormal taste, and also just not hungry. Drinking less  caffeine due to decreased appetite, but denies problems with caffeine withdrawal headaches in the past--she has days without caffeine without problems Not eating any food really, but drinking fluid, had a smoothie with protein powder.  No family h/o migraines  7/10 headache currently.  Some light sensitivity; no nausea (just the episode the other day).  +fatigue (like the flu), but no true myalgias.  PMH, PSH, SH reviewed  Outpatient Encounter Prescriptions as of 09/26/2015  Medication Sig Note  . alendronate (FOSAMAX) 70 MG tablet Take 70 mg by mouth once a week.  09/26/2015: Has been taking monthly, will start taking weekly  . aspirin EC 81 MG tablet Take 81 mg by mouth daily.   . Calcium-Vitamin D (CALTRATE 600 PLUS-VIT D PO) Take by mouth.   . latanoprost (XALATAN) 0.005 % ophthalmic solution 1 drop at bedtime.     . Multiple Vitamin (MULTIVITAMIN) capsule Take 1 capsule by mouth daily.   . Pseudoephedrine-Acetaminophen (SINUTAB SINUS PO) Take 1 tablet by mouth every 4 (four) hours as needed.   .Marland Kitchenibuprofen (ADVIL,MOTRIN) 200 MG tablet Take 200 mg by mouth every 4 (four) hours as needed. Reported on 09/26/2015   . olopatadine (PATANOL) 0.1 % ophthalmic solution Reported on 09/26/2015 05/04/2015: Received from: External Pharmacy  . [DISCONTINUED] erythromycin ([facility] ophthalmic ointment Place 1 application into both eyes 3 (three) times daily.    No  facility-administered encounter medications on file as of 09/26/2015.    No Known Allergies  ROS:  No known fever. No true vertigo, but intermittent problems with equilibrium. No numbness, tingling, weakness or other neuro symptoms. No chest pain, palpitations. No urinary or bowel complaints. No edema, bleeding, bruising or rash.  See HPI, +headache. No rash. Lesion/spider bite on leg hs healed/resolved.    PHYSICAL EXAM: BP 118/70 mmHg  Pulse 68  Temp(Src) 100.6 F (38.1 C) (Tympanic)  Ht 5' 2" (1.575 m)  Wt 125 lb (56.7 kg)  BMI 22.86  kg/m2  Well developed, tired appearing female in no distress.  She reports some light sensitivity, but was able to remain in room with lights on. HEENT: PERRL, EOMI, conjunctiva and sclera are clear.  Diffusely tender across upper head--frontal sinuses, temporalis muscles and temporal arteries also were slightly tender. Maxillary sinuses nontender. Nasal mucosa was normal--slight yellow crust noted on the right. OP was clear Fundi benign. No nystagmus. Neck: no lymphadenopathy thyromegaly, mass or bruit Heart: regular rate and rhythm without murmur Lungs: clear bilaterally Back: no CVA tenderness Abdomen: soft, nontender, no organomegaly or mass Neuro: alert and oriented. Cranial nerves intact, normal strength, finger to nose, negative Romberg, DTR's symmetric.  Psych: normal mood, affect, hygiene and grooming  ASSESSMENT/PLAN:  Headache, unspecified headache type - normal neuro exam; some neuro symptoms of dysequilibrium, intermittent - Plan: ketorolac (TORADOL) injection 60 mg, CBC with Differential/Platelet, Sedimentation Rate, [place] spotted fvr abs pnl(IgG+IgM), B. Burgdorfi Antibodies  Fever, unspecified fever cause - low grade fever noted. given headache, concern for RMSF. She is outdoors a lot - Plan: [place] spotted fvr abs pnl(IgG+IgM), B. Burgdorfi Antibodies, doxycycline (VIBRA-TABS) 100 MG tablet  Other fatigue - check labs; may be related to virus/other illness   Consider head CT--discussed neuro symptoms and findings to go to ER for further evlauation.    CBC, ESR.  RMSF and Lyme titers Treat presumptively with doxycycline.  Risks/side effects reviewed.  toradol 33m IM given    Continue to drink plenty of fluids. Don't completely eliminate caffeine. Take 1-2 Aleve twice daily as needed for pain or headache, OR up to 8011mibuprofen three times daily as needed.  Take these with food, don't mix and match them (take one or the other).  If they bother your  stomach, stop and switch to extra strength tylenol. Wait at least 6 hours after the injection from the office before starting ibuprofen or aleve.  If have worsening problems with equilibrium, falls, numbness, tingling, weakness, vomiting or other neurologic symptoms, then you need to go to the ER for neurologic evaluation (and probably imaging).  I suspect this is either a viral illness (vertigo, low grade fever, overall fatigue), but cannot rule out a tickborne illness such [facility] (headache, fever), so want to cover for that just in case.  We discussed the sun sensitivity with the antibiotic--use sunscreen or stay inside.  Return in the next week or so if you have persistent symptoms, sooner if any worsening or new problems (ie rash).

## 2015-09-26 NOTE — Patient Instructions (Signed)
  Continue to drink plenty of fluids. Don't completely eliminate caffeine. Take 1-2 Aleve twice daily as needed for pain or headache, OR up to 800mg  ibuprofen three times daily as needed.  Take these with food, don't mix and match them (take one or the other).  If they bother your stomach, stop and switch to extra strength tylenol. Wait at least 6 hours after the injection from the office before starting ibuprofen or aleve.  If have worsening problems with equilibrium, falls, numbness, tingling, weakness, vomiting or other neurologic symptoms, then you need to go to the ER for neurologic evaluation (and probably imaging).  I suspect this is either a viral illness (vertigo, low grade fever, overall fatigue), but cannot rule out a tickborne illness such as [facility] (headache, fever), so want to cover for that just in case.  We discussed the sun sensitivity with the antibiotic--use sunscreen or stay inside.  Return in the next week or so if you have persistent symptoms, sooner if any worsening or new problems (ie rash).

## 2015-09-27 LAB — ROCKY MTN SPOTTED FVR ABS PNL(IGG+IGM)
RMSF IGG: NOT DETECTED
RMSF IgM: NOT DETECTED

## 2015-09-27 LAB — SEDIMENTATION RATE: Sed Rate: 19 mm/hr (ref 0–30)

## 2015-09-27 LAB — LYME AB/WESTERN BLOT REFLEX: B burgdorferi Ab IgG+IgM: <0.9 Index (ref <0.90)

## 2015-10-03 ENCOUNTER — Telehealth: Payer: Self-pay

## 2015-10-03 NOTE — Telephone Encounter (Signed)
Pt states she was given doxycycline at appt last week, she has taken 7 days worth of medication and is questioning if she needs to complete this medication. She states it makes her so nauseated and unable to eat. Pt states that she is feeling much better than last week, but still tired. Pt can be reached at [phone]. Trixie Rude/RLB

## 2015-10-03 NOTE — Telephone Encounter (Signed)
Patient advised to stop.

## 2015-10-03 NOTE — Telephone Encounter (Signed)
Patient was reportedly advised of the following info, when given her lab results: "Please advise patient that her [place] Spotted Fever and Lyme titers were negative. Since she improved so quickly after starting the antibiotics, I don't think it is unreasonable to complete the course of doxycycline . If you are having side effects, you can stop it"  Tell her she can stop the medication since it is causing significant side effects.

## 2015-10-11 ENCOUNTER — Encounter: Payer: Self-pay | Admitting: *Deleted

## 2015-10-11 ENCOUNTER — Telehealth: Payer: Self-pay | Admitting: *Deleted

## 2015-10-11 NOTE — Telephone Encounter (Signed)
Patient called and she is still having lingering symptoms. Still extremely tired. Gets home from work at night and goes straight to bed and usually sleeps til morning. She is having extreme pain in her ankles and hamstrings. Has used heat and advil and does not help. Right leg is so tight. She has very little appetite. Does say that her HA's have gone away on a positive note. Please advise.

## 2015-10-11 NOTE — Telephone Encounter (Signed)
Spoke with patient and scheduled her for 10-15-15 with JCL.

## 2015-10-11 NOTE — Telephone Encounter (Signed)
Needs to schedule OV with regular provider

## 2015-10-12 ENCOUNTER — Ambulatory Visit (INDEPENDENT_AMBULATORY_CARE_PROVIDER_SITE_OTHER): Payer: 59 | Admitting: Family Medicine

## 2015-10-12 ENCOUNTER — Encounter: Payer: Self-pay | Admitting: Family Medicine

## 2015-10-12 VITALS — BP 112/64 | HR 51 | Temp 98.1°F | Wt 121.2 lb

## 2015-10-12 DIAGNOSIS — L519 Erythema multiforme, unspecified: Secondary | ICD-10-CM | POA: Diagnosis not present

## 2015-10-12 LAB — COMPREHENSIVE METABOLIC PANEL
ALBUMIN: 3.4 g/dL — AB (ref 3.6–5.1)
ALT: 18 U/L (ref 6–29)
AST: 15 U/L (ref 10–35)
Alkaline Phosphatase: 90 U/L (ref 33–130)
BILIRUBIN TOTAL: 0.6 mg/dL (ref 0.2–1.2)
BUN: 11 mg/dL (ref 7–25)
CALCIUM: 8.4 mg/dL — AB (ref 8.6–10.4)
CHLORIDE: 100 mmol/L (ref 98–110)
CO2: 24 mmol/L (ref 20–31)
Creat: 0.62 mg/dL (ref 0.50–0.99)
Glucose, Bld: 114 mg/dL — ABNORMAL HIGH (ref 65–99)
Potassium: 4.5 mmol/L (ref 3.5–5.3)
Sodium: 134 mmol/L — ABNORMAL LOW (ref 135–146)
Total Protein: 6.1 g/dL (ref 6.1–8.1)

## 2015-10-12 LAB — CBC WITH DIFFERENTIAL/PLATELET
BASOS ABS: 0 {cells}/uL (ref 0–200)
Basophils Relative: 0 %
Eosinophils Absolute: 109 cells/uL (ref 15–500)
Eosinophils Relative: 1 %
HCT: 36.2 % (ref 35.0–45.0)
HEMOGLOBIN: 12.1 g/dL (ref 11.7–15.5)
Lymphocytes Relative: 9 %
Lymphs Abs: 981 cells/uL (ref 850–3900)
MCH: 28.9 pg (ref 27.0–33.0)
MCHC: 33.4 g/dL (ref 32.0–36.0)
MCV: 86.4 fL (ref 80.0–100.0)
MPV: 10 fL (ref 7.5–12.5)
Monocytes Absolute: 545 cells/uL (ref 200–950)
Monocytes Relative: 5 %
NEUTROS PCT: 85 %
Neutro Abs: 9265 cells/uL — ABNORMAL HIGH (ref 1500–7800)
Platelets: 296 10*3/uL (ref 140–400)
RBC: 4.19 MIL/uL (ref 3.80–5.10)
RDW: 13.3 % (ref 11.0–15.0)
WBC: 10.9 10*3/uL — AB (ref 4.0–10.5)

## 2015-10-12 MED ORDER — PREDNISONE 20 MG PO TABS: ORAL_TABLET | ORAL | Status: DC

## 2015-10-12 NOTE — Progress Notes (Signed)
   Subjective:    Patient ID: [name] [name], female    DOB: [birth year], 65 y.o.   MRN: [id number]  HPI she was seen on May 31 and at that time placed on Vibra-Tabs. She finished the medication as prescribed. She has continued to have difficulty however with fatigue as well as a metallic taste in her mouth. Over the last several days she has also noted fever, chills, night sweats. Monday of this week she noted some discomfort in the right hamstrings as well as in the calf. She then noted difficulty on the left leg and some erythematous type lesions. She also is having difficulty walking at the present time. No cough, congestion, mouth sores.    Review of Systems     Objective:   Physical Exam Alert and slightly toxic appearing. TMs normal. Throat is clear. Neck is supple without adenopathy. Abdominal exam shows no hepatosplenomegaly. No skin lesions were noted on her abdomen. Lower extremities does show some erythematous quite tender nodules present on both lower extremities as well as an erythematous circular type rash only right lateral calf and mottled appearing nonerythematous lesions on both calves.       Assessment & Plan:  Erythema multiforme - Plan: CBC with Differential/Platelet, Comprehensive metabolic panel, predniSONE (DELTASONE) 20 MG tablet Her symptoms are most consistent with erythema multiforme although the reddish lesions could possibly represent erythema nodosum. I will place her on prednisone and do blood work on her. Return here on Monday.

## 2015-10-12 NOTE — Patient Instructions (Signed)
Erythema Multiforme Erythema multiforme is a rash that usually occurs on the skin, but can also occur on the lips and on the inside of the mouth. It is usually a mild condition that goes away on its own. It most often affects young adults and children. The rash shows up suddenly and often lasts 1-4 weeks. In some cases, the rash may come back again after clearing up. CAUSES  The cause of erythema multiforme may be an overreaction by the body's immune system to a trigger.  Common triggers include:   Infection, most commonly by the cold sore virus (human herpes virus, HSV), bacteria, or fungus. Less common triggers include:   Medicines.   Other illnesses.  In some cases, the cause may not be known.  SIGNS AND SYMPTOMS  The rash from erythema multiforme shows up suddenly. It may appear days after exposure to the trigger. It may start as small, red, round or oval marks that become bumps or raised welts over 24-48 hours. These bumps may resemble a target or a "bull's eye." These can spread and be quite large (about 1 inch [2.5 cm]). There may be mild itching or burning of the skin at first.  These skin changes usually appear first on the backs of the hands. They may then spread to the tops of the feet, the arms, the elbows, the knees, the palms, and the soles of the feet. There may be a mild rash on the lips and lining of the mouth. The skin rash may show up in waves over a few days.  It may take 2-4 weeks for the rash to go away. The rash may return at a later time.  DIAGNOSIS  Diagnosis of erythema multiforme is usually made based on a physical exam and medical history. To help confirm the diagnosis, a small piece of skin tissue is sometimes removed (skin biopsy) so it can be examined under a microscope by a specialist (pathologist). TREATMENT  Most episodes of erythema multiforme heal on their own. Treatment may not be needed. Your health care provider will recommend removing or avoiding the  trigger if possible. If the trigger is an infection or other illness, you may receive treatment for that infection or illness. You may also be given medicine for itching. Other medicines may be used for severe cases or to help prevent repeat bouts of erythema multiforme.  HOME CARE INSTRUCTIONS   Take medicines only as directed by your health care provider.   If possible, avoid known triggers.   If a medicine was your trigger, be sure to notify all of your health care providers. You should avoid this medicine or any like it in the future.   If your trigger was a herpes virus infection, use sunscreen lotion and sunscreen-containing lip balm to prevent sunlight triggered outbreaks of herpes virus.   Apply moist compresses as needed to help control itching. Cool or warm baths may also help. Avoid hot baths or showers.   Eat soft foods if you have mouth sores.   Keep all follow-up visits as directed by your health care provider. This is important.  SEEK MEDICAL CARE IF:   Your rash shows up again in the future.  You have a fever. SEEK IMMEDIATE MEDICAL CARE IF:   You develop redness and swelling on your lips or in your mouth.  You have a burning feeling on your lips or in your mouth.  You develop blisters or open sores on your mouth, lips, vagina, penis, or   anus.  You have eye pain, or you have redness or drainage in your eye.  You develop blisters on your skin.  You have difficulty breathing.  You have difficulty swallowing, or you start drooling.  You have blood in your urine.  You have pain with urination.   This information is not intended to replace advice given to you by your health care provider. Make sure you discuss any questions you have with your health care provider.   Document Released: 04/14/2005 Document Revised: 05/05/2014 Document Reviewed: 12/06/2013 Elsevier Interactive Patient Education 2016 [employer]  

## 2015-10-15 ENCOUNTER — Encounter: Payer: Self-pay | Admitting: Family Medicine

## 2015-10-15 ENCOUNTER — Ambulatory Visit (INDEPENDENT_AMBULATORY_CARE_PROVIDER_SITE_OTHER): Payer: 59 | Admitting: Family Medicine

## 2015-10-15 VITALS — BP 110/70 | HR 58 | Wt 121.8 lb

## 2015-10-15 DIAGNOSIS — L519 Erythema multiforme, unspecified: Secondary | ICD-10-CM | POA: Diagnosis not present

## 2015-10-15 DIAGNOSIS — G479 Sleep disorder, unspecified: Secondary | ICD-10-CM | POA: Diagnosis not present

## 2015-10-15 MED ORDER — ZOLPIDEM TARTRATE 5 MG PO TABS: 5.0000 mg | ORAL_TABLET | Freq: Every evening | ORAL | Status: DC | PRN

## 2015-10-15 NOTE — Patient Instructions (Signed)
2 pills for the next 3 days then take 1 pill for the next 3 days and then a half a pill until her goal

## 2015-10-15 NOTE — Progress Notes (Signed)
   Subjective:    Patient ID: [name] [name], female    DOB: [birth year], 65 y.o.   MRN: [id number]  HPI Here for a recheck. She states that she is feeling much better and having no foot discomfort and has noted the rash has gone away. The painful lesions have also diminished in size. She would also like to discuss a sleep med. She does occasionally have difficulty with sleep.   Review of Systems     Objective:   Physical Exam Alert and in no distress. Exam of her lower extremities shows resolving erythema, almost completely gone. The painful lesion has greatly diminished.       Assessment & Plan:  Erythema multiforme  Sleep disturbance - Plan: zolpidem (AMBIEN) 5 MG tablet  The skin manifestation was questionable is between erythema nodosum and erythema multiforme however at this point she is doing better. I will have her go on tapering dose of steroid over the next 9 days and stop. Caution her to be aware that if his symptoms recur, we will need to reevaluate. Will also give Ambien. Recommend she use 2 or 3 days in a row to try to break the cycle and then as needed after that.

## 2015-12-20 ENCOUNTER — Encounter: Payer: Self-pay | Admitting: Family Medicine

## 2015-12-20 ENCOUNTER — Ambulatory Visit (INDEPENDENT_AMBULATORY_CARE_PROVIDER_SITE_OTHER): Payer: 59 | Admitting: Family Medicine

## 2015-12-20 VITALS — BP 120/80 | HR 72 | Temp 98.2°F | Wt 119.0 lb

## 2015-12-20 DIAGNOSIS — H01005 Unspecified blepharitis left lower eyelid: Secondary | ICD-10-CM | POA: Diagnosis not present

## 2015-12-20 MED ORDER — ERYTHROMYCIN 5 MG/GM OP OINT: TOPICAL_OINTMENT | OPHTHALMIC | 0 refills | Status: DC

## 2015-12-20 NOTE — Patient Instructions (Signed)
Use the antibiotic ointment as prescribed for the next 7-10 days. Also use warm compresses and baby shampoo scrubs to the left lids twice daily as discussed.  Call if you are not back to baseline by next week or if you worsen.   Blepharitis Blepharitis is inflammation of the eyelids. Blepharitis may happen with:  Reddish, scaly skin around the scalp and eyebrows.  Burning or itching of the eyelids.  Eye discharge at night that causes the eyelashes to stick together in the morning.  Eyelashes that fall out.  Sensitivity to light. HOME CARE INSTRUCTIONS Pay attention to any changes in how you look or feel. Follow these instructions to help with your condition: Keeping Clean  Wash your hands often.  Wash your eyelids with warm water or with warm water that is mixed with a small amount of baby shampoo. Do this two times per day or as often as needed.  Wash your face and eyebrows at least once a day.  Use a clean towel each time you dry your eyelids. Do not use this towel to clean or dry other areas of your body. Do not share your towel with anyone. General Instructions  Avoid wearing makeup until you get better. Do not share makeup with anyone.  Avoid rubbing your eyes.  Apply warm compresses to your eyes 2 times per day for 10 minutes at a time, or as told by your health care provider.  If you were prescribed an antibiotic ointment or steroid drops, apply or use the medicine as told by your health care provider. Do not stop using the medicine even if you feel better.  Keep all follow-up visits as told by your health care provider. This is important. SEEK MEDICAL CARE IF:  Your eyelids feel hot.  You have blisters or a rash on your eyelids.  The condition does not go away in 2-4 days.  The inflammation gets worse. SEEK IMMEDIATE MEDICAL CARE IF:  You have pain or redness that gets worse or spreads to other parts of your face.  Your vision changes.  You have pain when  looking at lights or moving objects.  You have a fever.   This information is not intended to replace advice given to you by your health care provider. Make sure you discuss any questions you have with your health care provider.   Document Released: 04/11/2000 Document Revised: 01/03/2015 Document Reviewed: 08/07/2014 Elsevier Interactive Patient Education [employer]2016 Elsevier Inc.

## 2015-12-20 NOTE — Progress Notes (Signed)
I have called in patient's ambien as it was never called in per pharmacy back in June

## 2015-12-20 NOTE — Progress Notes (Signed)
   Subjective:    Patient ID: [name] EmmsKathi K Argabright, female    DOB: 03/03/1951, 65 y.o.   MRN: [id number]  HPI Chief Complaint  Patient presents with  . Other    stye and possible sinus infection- headache, pressure over eyes. cough   She is here with complaints of a 4-5 days redness and irritation to her left lower eyelid. Using erythromycin ointment for past 3 days and warm compresses. She thought it was improving yesterday but this morning it was worse.  States she had this one time in the past 2-3 years and had leftover Erythromycin ointment from that time. Denies fever, chills, vision changes, eye pain.   Also complains of a 24 hour history of sinus pressure, dull frontal headache and dry cough. Denies rhinorrhea, nasal congestion, ear pain, sore throat.   No recent sick contacts. Does not smoke.   Reviewed allergies, medications, past medical, and social history.     Review of Systems Pertinent positives and negatives in the history of present illness.     Objective:   Physical Exam  Constitutional: She appears well-developed and well-nourished. No distress.  HENT:  Right Ear: Tympanic membrane and ear canal normal.  Left Ear: Tympanic membrane and ear canal normal.  Nose: Nose normal. Right sinus exhibits no maxillary sinus tenderness and no frontal sinus tenderness. Left sinus exhibits no maxillary sinus tenderness and no frontal sinus tenderness.  Mouth/Throat: Uvula is midline, oropharynx is clear and moist and mucous membranes are normal.  Eyes: Conjunctivae and EOM are normal. Pupils are equal, round, and reactive to light. Left eye exhibits hordeolum. Left eye exhibits no discharge and no exudate.    Cardiovascular: Normal rate, regular rhythm and normal heart sounds.   Pulmonary/Chest: Effort normal and breath sounds normal.  Lymphadenopathy:    She has no cervical adenopathy.   BP 120/80   Pulse 72   Temp 98.2 F (36.8 C) (Oral)   Wt 119 lb (54 kg)   BMI 21.77  kg/m       Assessment & Plan:  Blepharitis of left lower eyelid - Plan: erythromycin (ROMYCIN) ophthalmic ointment  Advised to use warm compresses and perform lid scrubs with baby shampoo at least twice daily. Also advised that she discard her current eye makeup and avoid wearing eye makeup for the next 2-3 days. She will use erythromycin ointment at bedtime for the next 7-10 days. Prescription sent to pharmacy. She does not appear to have a sinus infection at this point and discussed that if her symptoms worsen she will call and let me know. Follow-up as needed.

## 2016-03-06 ENCOUNTER — Institutional Professional Consult (permissible substitution): Payer: 59 | Admitting: Family Medicine

## 2016-03-19 ENCOUNTER — Ambulatory Visit
Admission: RE | Admit: 2016-03-19 | Discharge: 2016-03-19 | Disposition: A | Discharge status: Home / Self Care | Payer: 59 | Source: Ambulatory Visit | Attending: Family Medicine | Admitting: Family Medicine

## 2016-03-19 ENCOUNTER — Other Ambulatory Visit: Payer: Self-pay

## 2016-03-19 ENCOUNTER — Encounter: Payer: Self-pay | Admitting: Family Medicine

## 2016-03-19 ENCOUNTER — Ambulatory Visit (INDEPENDENT_AMBULATORY_CARE_PROVIDER_SITE_OTHER): Payer: 59 | Admitting: Family Medicine

## 2016-03-19 VITALS — BP 130/70 | HR 68 | Resp 16 | Wt 119.8 lb

## 2016-03-19 DIAGNOSIS — R5383 Other fatigue: Secondary | ICD-10-CM

## 2016-03-19 DIAGNOSIS — R61 Generalized hyperhidrosis: Secondary | ICD-10-CM | POA: Diagnosis not present

## 2016-03-19 DIAGNOSIS — R918 Other nonspecific abnormal finding of lung field: Secondary | ICD-10-CM

## 2016-03-19 LAB — POCT URINALYSIS DIPSTICK
BILIRUBIN UA: NEGATIVE
Blood, UA: NEGATIVE
Glucose, UA: NEGATIVE
KETONES UA: NEGATIVE
LEUKOCYTES UA: NEGATIVE
NITRITE UA: NEGATIVE
PH UA: 6
PROTEIN UA: NEGATIVE
Spec Grav, UA: 1.015
Urobilinogen, UA: NEGATIVE

## 2016-03-19 LAB — TSH: TSH: 1.49 mIU/L

## 2016-03-19 NOTE — Progress Notes (Signed)
   Subjective:    Patient ID: [name] [name], female    DOB: 02-10-1951, 65 y.o.   MRN: [id number]  HPI She is here for evaluation of a several month history of difficulty with night sweats, chills, postnasal drainage and clearing the throat but no coughing and some fatigue. No sore throat, earache, weight change, nausea, vomiting, diarrhea, urologic or bowel problems. She dates this to previous episode of a viral infection and subsequently developed erythema multiform reverses nodosum. She is postmenopausal and never really had hot flashes. She also recently had a ankle fracture and blood work from late October was reviewed including CBC and see him at which was negative.   Review of Systems     Objective:   Physical Exam Alert and in no distress. Tympanic membranes and canals are normal. Pharyngeal area is normal. Neck is supple without adenopathy or thyromegaly. Cardiac exam shows a regular sinus rhythm without murmurs or gallops. Lungs are clear to auscultation. Donald exam shows no masses or tenderness. No inguinal or axillary adenopathy.      Assessment & Plan:  Night sweats - Plan: DG Chest 2 View, TSH, Sedimentation rate, Urinalysis Dipstick  Fatigue, unspecified type - Plan: DG Chest 2 View, TSH, Sedimentation rate, Urinalysis Dipstick Not sure of the cause of this but must consider an occult process.

## 2016-03-20 LAB — SEDIMENTATION RATE: Sed Rate: 42 mm/hr — ABNORMAL HIGH (ref 0–30)

## 2016-03-21 ENCOUNTER — Ambulatory Visit (INDEPENDENT_AMBULATORY_CARE_PROVIDER_SITE_OTHER): Payer: 59 | Admitting: Physician Assistant

## 2016-03-21 VITALS — BP 102/66 | HR 55 | Temp 98.5°F | Resp 18 | Ht 62.0 in | Wt 120.0 lb

## 2016-03-21 DIAGNOSIS — S61111A Laceration without foreign body of right thumb with damage to nail, initial encounter: Secondary | ICD-10-CM | POA: Diagnosis not present

## 2016-03-21 MED ORDER — CEPHALEXIN 500 MG PO CAPS: 500.0000 mg | ORAL_CAPSULE | Freq: Three times a day (TID) | ORAL | 0 refills | Status: DC

## 2016-03-21 NOTE — Patient Instructions (Addendum)
Okay to take 600 mg of Ibuprofen every 6-8 hours if needed for pain.    WOUND CARE Please return in 10 days to have your stitches/staples removed or sooner if you have concerns. Marland Kitchen. Keep area clean and dry for 24 hours. Do not remove bandage, if applied. . After 24 hours, remove bandage and wash wound gently with mild soap and warm water. Reapply a new bandage after cleaning wound, if directed. . Continue daily cleansing with soap and water until stitches/staples are removed. . Do not apply any ointments or creams to the wound while stitches/staples are in place, as this may cause delayed healing. . Notify the office if you experience any of the following signs of infection: Swelling, redness, pus drainage, streaking, fever >101.0 F . Notify the office if you experience excessive bleeding that does not stop after 15-20 minutes of constant, firm pressure.      IF you received an x-ray today, you will receive an invoice from [facility]. Please contact [facility] at [phone] with questions or concerns regarding your invoice.   IF you received labwork today, you will receive an invoice from [facility]. Please contact [facility] at [phone] with questions or concerns regarding your invoice.   Our billing staff will not be able to assist you with questions regarding bills from these companies.  You will be contacted with the lab results as soon as they are available. The fastest way to get your results is to activate your My Chart account. Instructions are located on the last page of this paperwork. If you have not heard from us regarding the results in 2 weeks, please contact this office.

## 2016-03-21 NOTE — Progress Notes (Signed)
   03/21/2016 10:49 AM   DOB: [birth year] / MRN: [id number]  SUBJECTIVE:  [name] [name] is a 65 y.o. female presenting for a right thumb lac sustained at roughly 12 pm yesterday via a mandolin slicer.  Reports that she could not spend all day waiting at the ED and all other UC were closed.  She has bandaged the wound and applied ointment.  Complains that the skin and nail "want to come off." Denies any weakness and change in function of the hand.   She has No Known Allergies.   She  has a past medical history of Glaucoma; Hyperlipidemia; and Osteopenia.    She  reports that she has never smoked. She has never used smokeless tobacco. She reports that she drinks alcohol. She reports that she does not use drugs. She  has no sexual activity history on file. The patient  has no past surgical history on file.  Her family history is not on file.  Review of Systems  Constitutional: Negative for chills and fever.  Musculoskeletal: Negative for joint pain and myalgias.  Skin: Negative for itching and rash.  Endo/Heme/Allergies: Does not bruise/bleed easily.    The problem list and medications were reviewed and updated by myself where necessary and exist elsewhere in the encounter.   OBJECTIVE:  BP 102/66 (BP Location: Right Arm, Patient Position: Sitting, Cuff Size: Small)   Pulse (!) 55   Temp 98.5 F (36.9 C) (Oral)   Resp 18   Ht 5\' 2"  (1.575 m)   Wt 120 lb (54.4 kg)   SpO2 99%   BMI 21.95 kg/m   Physical Exam  Constitutional: She is oriented to person, place, and time.  Cardiovascular: Normal rate and regular rhythm.   Pulmonary/Chest: Effort normal and breath sounds normal.  Musculoskeletal:       Hands: Neurological: She is alert and oriented to person, place, and time. She displays normal reflexes. No cranial nerve deficit. She exhibits normal muscle tone. Coordination normal.  Skin: Skin is warm and dry.   Risk and benefits discussed and verbal consent obtained. Anesthetic  allergies reviewed. Patient anesthetized using 1:1 mix of 2% lidocaine without epi and Marcaine. The wound was cleansed thoroughly with soap and water. Sterile prep and drape. Wound closed with 2 VM throws using 4-0 Ethilon suture material and 1 SI throw through the nail. Hemostasis achieved. Mupirocin applied to the wound and bandage placed. The patient tolerated well. Wound instructions were provided and the patient is to return in 10 days for suture removal.     No results found.  ASSESSMENT AND PLAN  [name] was seen today for finger cut.  Diagnoses and all orders for this visit:  Laceration of right thumb without foreign body with damage to nail, initial encounter:  Comments: Wound somehwhat old but not greater than 24 hours.  Will start a short course of keflex to prevent infection.  RTC in 10 days.  Ibuprofen 600 q6-8 for pain.  Orders: -     cephALEXin (KEFLEX) 500 MG capsule; Take 1 capsule (500 mg total) by mouth 3 (three) times daily.    The patient is advised to call or return to clinic if she does not see an improvement in symptoms, or to seek the care of the closest emergency department if she worsens with the above plan.   Deliah BostonMichael Terris Germano, MHS, PA-C [facility] 03/21/2016 10:49 AM

## 2016-03-31 ENCOUNTER — Ambulatory Visit (INDEPENDENT_AMBULATORY_CARE_PROVIDER_SITE_OTHER): Payer: 59 | Admitting: Physician Assistant

## 2016-03-31 DIAGNOSIS — Z4802 Encounter for removal of sutures: Secondary | ICD-10-CM

## 2016-04-15 ENCOUNTER — Encounter: Payer: Self-pay | Admitting: Family Medicine

## 2016-05-17 ENCOUNTER — Other Ambulatory Visit: Payer: Self-pay | Admitting: Family Medicine

## 2016-05-17 DIAGNOSIS — G479 Sleep disorder, unspecified: Secondary | ICD-10-CM

## 2016-05-19 NOTE — Telephone Encounter (Signed)
ok 

## 2016-05-19 NOTE — Telephone Encounter (Signed)
[place] [facility] [employer] [facility] [employer]

## 2016-05-19 NOTE — Telephone Encounter (Signed)
Is this okay to refill? 

## 2016-05-19 NOTE — Telephone Encounter (Signed)
Called in med to pharmacy  

## 2016-05-30 ENCOUNTER — Ambulatory Visit (INDEPENDENT_AMBULATORY_CARE_PROVIDER_SITE_OTHER): Payer: 59 | Admitting: Family Medicine

## 2016-05-30 ENCOUNTER — Encounter: Payer: Self-pay | Admitting: Family Medicine

## 2016-05-30 VITALS — BP 110/60 | HR 64 | Resp 16 | Wt 115.6 lb

## 2016-05-30 DIAGNOSIS — R61 Generalized hyperhidrosis: Secondary | ICD-10-CM | POA: Diagnosis not present

## 2016-05-30 DIAGNOSIS — G479 Sleep disorder, unspecified: Secondary | ICD-10-CM | POA: Diagnosis not present

## 2016-05-30 DIAGNOSIS — R5383 Other fatigue: Secondary | ICD-10-CM | POA: Diagnosis not present

## 2016-05-30 MED ORDER — CLARITHROMYCIN 500 MG PO TABS: 500.0000 mg | ORAL_TABLET | Freq: Two times a day (BID) | ORAL | 0 refills | Status: DC

## 2016-05-30 NOTE — Progress Notes (Signed)
   Subjective:    Patient ID: [name] [name], female    DOB: 10/19/1950, 66 y.o.   MRN: [id number]  HPI She is here for a recheck. She continues to have difficulty with night sweats as well as chills and occasional postnasal drainage but no cough, sinus congestion, sore throat, shortness of breath, nausea, vomiting, GI or GU issues. Review of the record indicates that she has had difficulty with this since mid May. She has had a good workup concerning this as well as back in May for Lyme disease etc. She also would like a refill on her sleep med. Review of Systems     Objective:   Physical Exam Alert and in no distress. Tympanic membranes and canals are normal. Pharyngeal area is normal. Neck is supple without adenopathy or thyromegaly. Cardiac exam shows a regular sinus rhythm without murmurs or gallops. Lungs are clear to auscultation. Abdominal exam shows no masses or tenderness. No inguinal or axillary adenopathy.       Assessment & Plan:  Night sweats - Plan: clarithromycin (BIAXIN) 500 MG tablet  Fatigue, unspecified type - Plan: clarithromycin (BIAXIN) 500 MG tablet  Sleep disturbance The Ambien was already renewed. Review of the record indicates that if she does not respond to my treating her with Biaxin, I will refer to rheumatology as this could potentially be an underlying occult collagen vascular issue. She is comfortable with that approach.

## 2016-06-12 ENCOUNTER — Telehealth: Payer: Self-pay | Admitting: Family Medicine

## 2016-06-12 NOTE — Telephone Encounter (Signed)
Refer her to rheumatology for evaluation of possible inflammatory condition. Explained to her that I want them to order blood work they feel is appropriate.

## 2016-06-12 NOTE — Telephone Encounter (Signed)
Pt called and states that she is still not feeling good, she finished her antibiotic yesterday and it had not effect, she is still having the same symptoms, the night sweats, being tired and being cold all the time, she said you mentioned blood work and would like to come in get that done but would also like to know what else needs to be the next step, please advise pt can be reached at [phone]

## 2016-06-13 NOTE — Telephone Encounter (Signed)
Patient informed of referral and verbalized understanding

## 2016-06-13 NOTE — Telephone Encounter (Signed)
I have faxed  Referral to Dr.truslow office

## 2016-06-23 ENCOUNTER — Other Ambulatory Visit (INDEPENDENT_AMBULATORY_CARE_PROVIDER_SITE_OTHER): Payer: 59

## 2016-06-23 DIAGNOSIS — Z111 Encounter for screening for respiratory tuberculosis: Secondary | ICD-10-CM

## 2016-06-24 ENCOUNTER — Other Ambulatory Visit: Payer: Self-pay | Admitting: Family Medicine

## 2016-06-24 DIAGNOSIS — D649 Anemia, unspecified: Secondary | ICD-10-CM

## 2016-06-24 NOTE — Progress Notes (Signed)
Recent blood work through Dr. Kellie Simmeringruslow showed a hemoglobin of 9.9. I will repeat this to make sure it is accurate as previously blood studies were normal.

## 2016-06-25 ENCOUNTER — Other Ambulatory Visit: Payer: 59

## 2016-06-25 DIAGNOSIS — D649 Anemia, unspecified: Secondary | ICD-10-CM

## 2016-06-25 LAB — CBC WITH DIFFERENTIAL/PLATELET
BASOS ABS: 79 {cells}/uL (ref 0–200)
Basophils Relative: 1 %
EOS ABS: 237 {cells}/uL (ref 15–500)
EOS PCT: 3 %
HCT: 30.6 % — ABNORMAL LOW (ref 35.0–45.0)
Hemoglobin: 9.8 g/dL — ABNORMAL LOW (ref 11.7–15.5)
LYMPHS PCT: 16 %
Lymphs Abs: 1264 cells/uL (ref 850–3900)
MCH: 26.1 pg — ABNORMAL LOW (ref 27.0–33.0)
MCHC: 32 g/dL (ref 32.0–36.0)
MCV: 81.4 fL (ref 80.0–100.0)
MPV: 9.7 fL (ref 7.5–12.5)
Monocytes Absolute: 790 cells/uL (ref 200–950)
Monocytes Relative: 10 %
NEUTROS ABS: 5530 {cells}/uL (ref 1500–7800)
NEUTROS PCT: 70 %
PLATELETS: 332 10*3/uL (ref 140–400)
RBC: 3.76 MIL/uL — ABNORMAL LOW (ref 3.80–5.10)
RDW: 15.6 % — AB (ref 11.0–15.0)
WBC: 7.9 10*3/uL (ref 4.0–10.5)

## 2016-06-25 LAB — TB SKIN TEST
INDURATION: 0 mm
TB SKIN TEST: NEGATIVE

## 2016-06-26 ENCOUNTER — Other Ambulatory Visit: Payer: 59

## 2016-06-26 ENCOUNTER — Telehealth: Payer: Self-pay | Admitting: Family Medicine

## 2016-06-26 NOTE — Telephone Encounter (Signed)
Pt informed I have faxed referral to [facility] Dr.Outlaw and they will call her to make appointment

## 2016-06-26 NOTE — Telephone Encounter (Signed)
Pt called stating that she spoke to Dr Susann GivensLalonde and he will be referring her to a GI. Pt wants us to know that she will be out of town on 07/01/16 - 07/07/16 in case we schedule the appointment

## 2016-06-26 NOTE — Progress Notes (Signed)
Her hemoglobin is 9.8. Prior to that the hemoglobin was around 12. She had a colonoscopy in 2015 which did show polyps and is scheduled for repeat colonoscopy in 5 years. She has continued to have difficulty with night sweats and she states she's lost over 10 pounds since this started. She also complains of anorexia but no nausea, vomiting, abdominal pain, constipation or diarrhea. I will schedule her to see Dr. Dulce Sellarutlaw.

## 2016-07-09 NOTE — Progress Notes (Signed)
   07/09/2016 4:24 PM   DOB: June 16, 1950 / MRN: [id number]  SUBJECTIVE:  [name] is a well appearing 66 y.o. here today for wound care. She denies exquisite tenderness at the site of the wound, nausea, emesis, fever and chills.  She has been compliant with medical therapy and recommendations thus far.   Review of Systems  Musculoskeletal: Negative for joint pain.  Neurological: Negative for focal weakness.    Problem list and medications reviewed and updated by myself where necessary, and exist elsewhere in the encounter.   OBJECTIVE:   Physical Exam  Two VM sutures in place on the right thumb.  Wound well approximated, clean, and dry.  Two suture removed without difficulty.  ROM and strength intact to challenge about the right thumb.   ASSESSMENT AND PLAN  Diagnoses and all orders for this visit:  Encounter for removal of sutures Comments: She is doing well. No complications. RTC PRN.     The patient was advised to call or return to clinic if she does not see an improvement in symptoms or to seek the care of the closest emergency department if she worsens with the above plan.   Deliah BostonMichael Clark, MHS, PA-C [facility] 07/09/2016 4:24 PM

## 2016-07-30 ENCOUNTER — Emergency Department (HOSPITAL_COMMUNITY)
Admission: EM | Admit: 2016-07-30 | Discharge: 2016-07-30 | Disposition: A | Discharge status: Home / Self Care | Payer: 59 | Attending: Emergency Medicine | Admitting: Emergency Medicine

## 2016-07-30 ENCOUNTER — Emergency Department (HOSPITAL_COMMUNITY): Payer: 59

## 2016-07-30 DIAGNOSIS — W01198A Fall on same level from slipping, tripping and stumbling with subsequent striking against other object, initial encounter: Secondary | ICD-10-CM | POA: Insufficient documentation

## 2016-07-30 DIAGNOSIS — Y939 Activity, unspecified: Secondary | ICD-10-CM | POA: Insufficient documentation

## 2016-07-30 DIAGNOSIS — S82031A Displaced transverse fracture of right patella, initial encounter for closed fracture: Secondary | ICD-10-CM | POA: Diagnosis not present

## 2016-07-30 DIAGNOSIS — S8991XA Unspecified injury of right lower leg, initial encounter: Secondary | ICD-10-CM | POA: Diagnosis present

## 2016-07-30 DIAGNOSIS — Y999 Unspecified external cause status: Secondary | ICD-10-CM | POA: Insufficient documentation

## 2016-07-30 DIAGNOSIS — Y9289 Other specified places as the place of occurrence of the external cause: Secondary | ICD-10-CM | POA: Insufficient documentation

## 2016-07-30 DIAGNOSIS — Z7982 Long term (current) use of aspirin: Secondary | ICD-10-CM | POA: Insufficient documentation

## 2016-07-30 LAB — BASIC METABOLIC PANEL
ANION GAP: 9 (ref 5–15)
BUN: 15 mg/dL (ref 6–20)
CALCIUM: 9.2 mg/dL (ref 8.9–10.3)
CO2: 27 mmol/L (ref 22–32)
CREATININE: 0.63 mg/dL (ref 0.44–1.00)
Chloride: 99 mmol/L — ABNORMAL LOW (ref 101–111)
Glucose, Bld: 102 mg/dL — ABNORMAL HIGH (ref 65–99)
Potassium: 5.3 mmol/L — ABNORMAL HIGH (ref 3.5–5.1)
SODIUM: 135 mmol/L (ref 135–145)

## 2016-07-30 LAB — CBC WITH DIFFERENTIAL/PLATELET
BASOS ABS: 0 10*3/uL (ref 0.0–0.1)
BASOS PCT: 0 %
EOS ABS: 0.1 10*3/uL (ref 0.0–0.7)
Eosinophils Relative: 1 %
HEMATOCRIT: 37.7 % (ref 36.0–46.0)
Hemoglobin: 12.2 g/dL (ref 12.0–15.0)
Lymphocytes Relative: 15 %
Lymphs Abs: 1.3 10*3/uL (ref 0.7–4.0)
MCH: 28.2 pg (ref 26.0–34.0)
MCHC: 32.4 g/dL (ref 30.0–36.0)
MCV: 87.1 fL (ref 78.0–100.0)
MONO ABS: 0.8 10*3/uL (ref 0.1–1.0)
MONOS PCT: 9 %
NEUTROS ABS: 6 10*3/uL (ref 1.7–7.7)
Neutrophils Relative %: 75 %
PLATELETS: 214 10*3/uL (ref 150–400)
RBC: 4.33 MIL/uL (ref 3.87–5.11)
RDW: 17.1 % — AB (ref 11.5–15.5)
WBC: 8.1 10*3/uL (ref 4.0–10.5)

## 2016-07-30 MED ORDER — SODIUM CHLORIDE 0.9 % IV BOLUS (SEPSIS)
250.0000 mL | Freq: Once | INTRAVENOUS | Status: AC
  Administered 2016-07-30: 250 mL via INTRAVENOUS

## 2016-07-30 MED ORDER — SODIUM CHLORIDE 0.9 % IV SOLN
INTRAVENOUS | Status: DC
  Administered 2016-07-30: 22:00:00 via INTRAVENOUS

## 2016-07-30 MED ORDER — HYDROCODONE-ACETAMINOPHEN 5-325 MG PO TABS: 1.0000 | ORAL_TABLET | Freq: Four times a day (QID) | ORAL | 0 refills | Status: DC | PRN

## 2016-07-30 MED ORDER — ONDANSETRON HCL 4 MG/2ML IJ SOLN
4.0000 mg | Freq: Once | INTRAMUSCULAR | Status: AC
  Administered 2016-07-30: 4 mg via INTRAVENOUS
  Filled 2016-07-30: qty 2

## 2016-07-30 MED ORDER — HYDROMORPHONE HCL 1 MG/ML IJ SOLN
1.0000 mg | Freq: Once | INTRAMUSCULAR | Status: AC
  Administered 2016-07-30: 1 mg via INTRAVENOUS
  Filled 2016-07-30: qty 1

## 2016-07-30 NOTE — ED Notes (Signed)
Patient return from X-ray 

## 2016-07-30 NOTE — Discharge Instructions (Signed)
Keep  knee immobilizer on. Call [facility] for follow-up in the morning. Take pain medicine as needed. Elevate the right leg and ice the knee is much as possible.

## 2016-07-30 NOTE — ED Triage Notes (Signed)
Pt was walking to the laundry room, slipped forward on the tile and fell onto knees. No LOC, no lightheaded or dizziness, pt states she did not hit her head. Swelling and deformity noted to right knee. 150 mcg fentanyl given on scene.

## 2016-07-30 NOTE — ED Notes (Signed)
Bed: FA21 Expected date:  Expected time:  Means of arrival:  Comments: EMS 66 yo female injury right knee-obvious deformity/Fentanyl 150 mcg

## 2016-07-30 NOTE — ED Provider Notes (Signed)
[facility]-EMERGENCY DEPT Provider Note   CSN: [id number] Arrival date & time: 07/30/16  2107     History   Chief Complaint Chief Complaint  Patient presents with  . Knee Pain    Right    HPI [name] [name] is a 66 y.o. female.  Patient status post fall in the laundry room where there was time. Slipped forward on the tile floor in her right knee hit hard. No loss of consciousness. No other injuries. Deformity noted to right knee. Brought in by EMS. Patient received 150 g of fentanyl in route.      Past Medical History:  Diagnosis Date  . Glaucoma   . Hyperlipidemia   . Osteopenia     Patient Active Problem List   Diagnosis Date Noted  . Hyperlipidemia with target LDL less than 100 03/10/2011  . Family history of heart disease in female family member before age 64 03/10/2011    No past surgical history on file.  OB History    No data available       Home Medications    Prior to Admission medications   Medication Sig Start Date End Date Taking? Authorizing Provider  aspirin EC 81 MG tablet Take 81 mg by mouth daily.   Yes Historical Provider, MD  Calcium-Vitamin D (CALTRATE 600 PLUS-VIT D PO) Take 1 tablet by mouth daily.    Yes Historical Provider, MD  ibuprofen (ADVIL,MOTRIN) 200 MG tablet Take 200 mg by mouth every 4 (four) hours as needed. Reported on 09/26/2015   Yes Historical Provider, MD  latanoprost (XALATAN) 0.005 % ophthalmic solution Place 1 drop into both eyes at bedtime.    Yes Historical Provider, MD  Multiple Vitamin (MULTIVITAMIN) capsule Take 1 capsule by mouth daily.   Yes Historical Provider, MD  predniSONE (DELTASONE) 10 MG tablet Take 10 mg by mouth daily. 06/26/16  Yes Historical Provider, MD  cephALEXin (KEFLEX) 500 MG capsule Take 1 capsule (500 mg total) by mouth 3 (three) times daily. Patient not taking: Reported on 05/30/2016 03/21/16   Ofilia Neas, PA-C  clarithromycin (BIAXIN) 500 MG tablet Take 1 tablet (500 mg total) by mouth 2 (two) times  daily. Patient not taking: Reported on 07/30/2016 05/30/16   Ronnald Nian, MD  erythromycin [facility]) ophthalmic ointment Use a thin layer to the lower left eyelid at bedtime Patient not taking: Reported on 03/21/2016 12/20/15   Avanell Shackleton, NP-C  HYDROcodone-acetaminophen (NORCO/VICODIN) 5-325 MG tablet Take 1-2 tablets by mouth every 6 (six) hours as needed for moderate pain. 07/30/16   Vanetta Mulders, MD  zolpidem (AMBIEN) 5 MG tablet take 1 tablet by mouth at bedtime if needed for sleep Patient not taking: Reported on 07/30/2016 05/19/16   Ronnald Nian, MD    Family History No family history on file.  Social History Social History  Substance Use Topics  . Smoking status: Never Smoker  . Smokeless tobacco: Never Used  . Alcohol use 0.0 oz/week     Comment: 2-3/week     Allergies   Patient has no known allergies.   Review of Systems Review of Systems  Constitutional: Negative for fever.  HENT: Negative for congestion.   Respiratory: Negative for shortness of breath.   Cardiovascular: Negative for chest pain.  Gastrointestinal: Negative for abdominal pain.  Genitourinary: Negative for dysuria.  Musculoskeletal: Positive for joint swelling. Negative for back pain and neck pain.  Skin: Negative for wound.  Neurological: Negative for syncope.  Hematological: Does not bruise/bleed  easily.  Psychiatric/Behavioral: Negative for confusion.     Physical Exam Updated Vital Signs BP 134/79 (BP Location: Left Arm)   Pulse (!) 57   Temp 98.3 F (36.8 C) (Oral)   Resp 18   SpO2 100%   Physical Exam  Constitutional: She is oriented to person, place, and time. She appears well-developed and well-nourished. No distress.  HENT:  Head: Normocephalic and atraumatic.  Mouth/Throat: Oropharynx is clear and moist.  Eyes: Conjunctivae and EOM are normal. Pupils are equal, round, and reactive to light.  Neck: Normal range of motion. Neck supple.  Cardiovascular: Normal rate, regular  rhythm and normal heart sounds.   Pulmonary/Chest: Effort normal and breath sounds normal.  Abdominal: Soft. Bowel sounds are normal. There is no tenderness.  Musculoskeletal: She exhibits edema, tenderness and deformity.  Normal except for right knee. Palpable transverse fracture through the knee. Can feel both pieces the proximally and distally. Swelling to the knee. No other deformity. Dorsalis pedis pulse distally is 2+. Sensation intact.  Neurological: She is alert and oriented to person, place, and time. No cranial nerve deficit or sensory deficit. She exhibits normal muscle tone. Coordination normal.  Skin: Skin is warm. Capillary refill takes less than 2 seconds.  Nursing note and vitals reviewed.    ED Treatments / Results  Labs (all labs ordered are listed, but only abnormal results are displayed) Labs Reviewed  CBC WITH DIFFERENTIAL/PLATELET - Abnormal; Notable for the following:       Result Value   RDW 17.1 (*)    All other components within normal limits  BASIC METABOLIC PANEL - Abnormal; Notable for the following:    Potassium 5.3 (*)    Chloride 99 (*)    Glucose, Bld 102 (*)    All other components within normal limits    EKG  EKG Interpretation  Date/Time:  Wednesday July 30 2016 22:22:31 EDT Ventricular Rate:  55 PR Interval:    QRS Duration: 90 QT Interval:  473 QTC Calculation: 453 R Axis:   54 Text Interpretation:  Sinus rhythm No previous ECGs available Interpretation limited secondary to artifact Confirmed by Shikara Mcauliffe  MD, Parks Czajkowski [id number]) on 07/30/2016 10:35:00 PM       Radiology Dg Knee Complete 4 Views Right  Result Date: 07/30/2016 CLINICAL DATA:  66 year old who fell in her laundry room at home earlier today onto the right knee. Initial encounter. EXAM: RIGHT KNEE - COMPLETE 4+ VIEW COMPARISON:  None. FINDINGS: Acute comminuted horizontal fracture involving the midportion of the patella into at least 3 fragments. Associated hemarthrosis and  prepatellar soft tissue swelling. No fractures elsewhere. Moderate medial compartment joint space narrowing. Osseous demineralization. IMPRESSION: 1. Acute traumatic comminuted horizontal fracture involving the midportion of the patella. 2. Moderate medial compartment osteoarthritis. 3. Osseous demineralization. Electronically Signed   By: Hulan Saas M.D.   On: 07/30/2016 21:45    Procedures Procedures (including critical care time)  Medications Ordered in ED Medications  0.9 %  sodium chloride infusion ( Intravenous New Bag/Given 07/30/16 2226)  sodium chloride 0.9 % bolus 250 mL (0 mLs Intravenous Stopped 07/30/16 2241)  HYDROmorphone (DILAUDID) injection 1 mg (1 mg Intravenous Given 07/30/16 2204)  ondansetron (ZOFRAN) injection 4 mg (4 mg Intravenous Given 07/30/16 2205)     Initial Impression / Assessment and Plan / ED Course  I have reviewed the triage vital signs and the nursing notes.  Pertinent labs & imaging results that were available during my care of the patient were reviewed  by me and considered in my medical decision making (see chart for details).      Patient with a transverse patellar fracture of the right knee. No other significant injuries. Patient preferred to be followed by [facility] since her husband has been seen by them. Discussed with Dr. Luiz Blare. They will follow her up in the office. Patient to have knee immobilizer in place to ambulate. Patient given medication for pain. She is to elevate the leg is much as possible and ice the knee is much as possible.  Final Clinical Impressions(s) / ED Diagnoses   Final diagnoses:  Closed displaced transverse fracture of right patella, initial encounter    New Prescriptions New Prescriptions   HYDROCODONE-ACETAMINOPHEN (NORCO/VICODIN) 5-325 MG TABLET    Take 1-2 tablets by mouth every 6 (six) hours as needed for moderate pain.     Vanetta Mulders, MD 07/30/16 2337

## 2016-07-30 NOTE — ED Notes (Signed)
Patient transported to X-ray 

## 2016-08-13 ENCOUNTER — Encounter: Payer: Self-pay | Admitting: Family Medicine

## 2016-09-23 ENCOUNTER — Ambulatory Visit (INDEPENDENT_AMBULATORY_CARE_PROVIDER_SITE_OTHER): Payer: 59 | Admitting: Family Medicine

## 2016-09-23 DIAGNOSIS — M8580 Other specified disorders of bone density and structure, unspecified site: Secondary | ICD-10-CM | POA: Diagnosis not present

## 2016-09-23 NOTE — Progress Notes (Signed)
   Subjective:    Patient ID: [name] [name], female    DOB: Feb 02, 1951, 66 y.o.   MRN: [id number]  HPI She is here for consult concerning bone health. Apparently she did have a DEXA scan done several years ago and was told that it was between osteopenia and osteoporosis. Her gynecologist did place her on Fosamax over she did not take it for even a full year. Since then she has had a fracture of her fibula and most recently patellar fracture. Both of these were from injuries from running or slipping and falling. She did say that the orthopedic surgeon told her that her patellar bone was relatively thin compared to what it should be in terms of when he put his [place]. There is also family history of a grandmother who is kyphotic and the mother that was taking Fosamax. She has been on vitamin D given to her by the orthopedic surgeon 50,000 units weekly for the last 7 weeks. Apparently she did not have a previous vitamin D level drawn.   Review of Systems     Objective:   Physical Exam Alert and in no distress. Exam of her recent right knee surgery does show the scar to be healing nicely. She is now starting into regular physical activity through PT.       Assessment & Plan:  Osteopenia, unspecified location - Plan: DG Bone Density, VITAMIN D 25 Hydroxy (Vit-D Deficiency, Fractures)  I'll check a bone density on her. Will probably place her back on Fosamax. Will also check her vitamin D level. I then discussed proper rehabilitation of her knee. Discussed scar revision as well as working on full range of motion to get past 0 extension.

## 2016-09-24 LAB — VITAMIN D 25 HYDROXY (VIT D DEFICIENCY, FRACTURES): Vit D, 25-Hydroxy: 52 ng/mL (ref 30–100)

## 2016-09-26 ENCOUNTER — Other Ambulatory Visit (HOSPITAL_COMMUNITY): Payer: Self-pay | Admitting: Orthopedic Surgery

## 2016-09-26 DIAGNOSIS — M25561 Pain in right knee: Secondary | ICD-10-CM

## 2016-09-29 ENCOUNTER — Ambulatory Visit (HOSPITAL_COMMUNITY)
Admission: RE | Admit: 2016-09-29 | Discharge: 2016-09-29 | Disposition: A | Discharge status: Home / Self Care | Payer: 59 | Source: Ambulatory Visit | Attending: Orthopedic Surgery | Admitting: Orthopedic Surgery

## 2016-09-29 ENCOUNTER — Ambulatory Visit (HOSPITAL_COMMUNITY): Admission: RE | Admit: 2016-09-29 | Payer: 59 | Source: Ambulatory Visit

## 2016-09-29 DIAGNOSIS — X58XXXA Exposure to other specified factors, initial encounter: Secondary | ICD-10-CM | POA: Insufficient documentation

## 2016-09-29 DIAGNOSIS — S82041A Displaced comminuted fracture of right patella, initial encounter for closed fracture: Secondary | ICD-10-CM | POA: Insufficient documentation

## 2016-09-29 DIAGNOSIS — M25561 Pain in right knee: Secondary | ICD-10-CM | POA: Diagnosis present

## 2016-12-04 ENCOUNTER — Telehealth: Payer: Self-pay | Admitting: Family Medicine

## 2016-12-04 NOTE — Telephone Encounter (Signed)
Faxed order to [facility] for DEXA and called pt to let her know they will call her to make appointment

## 2016-12-04 NOTE — Telephone Encounter (Signed)
Pt called and was trying to schedule a bone density test and called [facility] to schedule it and they do not have orders in there, she can be reached at work [phone] and her cell is [phone] would like to be called after this has been scheduled please or with instructions how to set up

## 2017-04-16 LAB — HM MAMMOGRAPHY

## 2017-04-16 LAB — HM DEXA SCAN

## 2017-04-24 ENCOUNTER — Encounter: Payer: Self-pay | Admitting: Family Medicine

## 2017-05-11 ENCOUNTER — Encounter: Payer: Self-pay | Admitting: Family Medicine

## 2017-05-11 ENCOUNTER — Ambulatory Visit: Payer: 59 | Admitting: Family Medicine

## 2017-05-11 VITALS — BP 128/80 | HR 58 | Ht 61.5 in | Wt 125.8 lb

## 2017-05-11 DIAGNOSIS — M810 Age-related osteoporosis without current pathological fracture: Secondary | ICD-10-CM | POA: Insufficient documentation

## 2017-05-11 MED ORDER — IBANDRONATE SODIUM 150 MG PO TABS: 150.0000 mg | ORAL_TABLET | ORAL | 3 refills | Status: DC

## 2017-05-11 NOTE — Progress Notes (Signed)
   Subjective:    Patient ID: [name] [name], female    DOB: 11-30-1950, 67 y.o.   MRN: [id number]  HPI She is here for consult.  She recently had a DEXA scan done which did show evidence of osteoporosis.  Last year she did sustain to trauma related fractures and the orthopedic surgeon did mention the fact that her bones seem to be softer than would be expected.  She has had a blood work done last year which did show normal calcium levels.   Review of Systems     Objective:   Physical Exam Alert and in no distress otherwise not examined      Assessment & Plan:  Osteoporosis without current pathological fracture, unspecified osteoporosis type - Plan: ibandronate (BONIVA) 150 MG tablet, VITAMIN D 25 Hydroxy (Vit-D Deficiency, Fractures), DISCONTINUED: ibandronate (BONIVA) 150 MG tablet  I discussed the diagnosis of osteoporosis with her and risk of further fracture.  She does keep himself quite physically active and I recommend she continue with that.  I will place her on Boniva and follow-up with the DEXA scan in 2 years.

## 2017-05-12 LAB — VITAMIN D 25 HYDROXY (VIT D DEFICIENCY, FRACTURES): VIT D 25 HYDROXY: 33.4 ng/mL (ref 30.0–100.0)

## 2017-05-13 ENCOUNTER — Telehealth: Payer: Self-pay | Admitting: Family Medicine

## 2017-05-13 NOTE — Telephone Encounter (Signed)
[facility]ed in [phone] rx printed

## 2017-06-17 ENCOUNTER — Ambulatory Visit: Payer: 59 | Admitting: Medical

## 2017-06-17 ENCOUNTER — Encounter: Payer: Self-pay | Admitting: Medical

## 2017-06-17 VITALS — BP 122/76 | HR 66 | Temp 98.3°F | Wt 123.2 lb

## 2017-06-17 DIAGNOSIS — J0110 Acute frontal sinusitis, unspecified: Secondary | ICD-10-CM

## 2017-06-17 DIAGNOSIS — Z9889 Other specified postprocedural states: Secondary | ICD-10-CM | POA: Diagnosis not present

## 2017-06-17 DIAGNOSIS — M810 Age-related osteoporosis without current pathological fracture: Secondary | ICD-10-CM | POA: Diagnosis not present

## 2017-06-17 DIAGNOSIS — H6123 Impacted cerumen, bilateral: Secondary | ICD-10-CM

## 2017-06-17 MED ORDER — AMOXICILLIN 875 MG PO TABS: 875.0000 mg | ORAL_TABLET | Freq: Two times a day (BID) | ORAL | 0 refills | Status: DC

## 2017-06-17 NOTE — Progress Notes (Signed)
Subjective:  [name] [name] is a 67 y.o. female who presents for respiratory infection.  Has head pressure, sinus pressure for 3+ weeks, but has worse ear pressure in the past week.  Using mucinex, hydration but her symptoms aren't improving after dealing with this for weeks.   May have had a fever.  No sore throat, no cough.  No SOB.  No NVD.   Patient is not a smoker.  She notes recently starting Boniva, but didn't read package insert til recently.  She has had recent oral surgery.   Worried about risk, complications.  No other aggravating or relieving factors.  No other c/o.  Past Medical History:  Diagnosis Date  . Glaucoma   . Hyperlipidemia   . Osteopenia    Current Outpatient Medications on File Prior to Visit  Medication Sig Dispense Refill  . aspirin EC 81 MG tablet Take 81 mg by mouth daily.    . Calcium-Vitamin D (CALTRATE 600 PLUS-VIT D PO) Take 1 tablet by mouth daily.     Marland Kitchen ibandronate (BONIVA) 150 MG tablet Take 1 tablet (150 mg total) by mouth every 30 (thirty) days. Take in the morning with a full glass of water, on an empty stomach. 3 tablet 3  . ibuprofen (ADVIL,MOTRIN) 200 MG tablet Take 200 mg by mouth every 4 (four) hours as needed. Reported on 09/26/2015    . Multiple Vitamin (MULTIVITAMIN) capsule Take 1 capsule by mouth daily.    Marland Kitchen zolpidem (AMBIEN) 5 MG tablet take 1 tablet by mouth at bedtime if needed for sleep 15 tablet 1  . latanoprost (XALATAN) 0.005 % ophthalmic solution Place 1 drop into both eyes at bedtime.      No current facility-administered medications on file prior to visit.     ROS as in subjective   Objective: BP 122/76   Pulse 66   Temp 98.3 F (36.8 C)   Wt 123 lb 3.2 oz (55.9 kg)   SpO2 98%   BMI 22.90 kg/m   General appearance: Alert, WD/WN, no distress                             Skin: warm, no rash                           Head: + frontal sinus tenderness,                            Eyes: conjunctiva normal, corneas clear,  PERRLA                            Ears: unable to visualize  TMs due to impacted cerumen bilat, external ear canals normal                          Nose: septum midline, turbinates swollen, with erythema and clear discharge             Mouth/throat: MMM, tongue normal, mild pharyngeal erythema                           Neck: supple, no adenopathy, no thyromegaly, non tender  Lungs: CTA bilaterally, no wheezes, rales, or rhonchi       Assessment  Encounter Diagnoses  Name Primary?  . Acute non-recurrent frontal sinusitis Yes  . Impacted cerumen of both ears   . Osteoporosis without current pathological fracture, unspecified osteoporosis type   . H/O oral surgery       Plan: Sinusitis - begin amoxicillin, hydrate well, and consider nasal saline flush.  Discussed findings.  Discussed risk/benefits of procedure and patient agrees to procedure. Successfully used warm water lavage to remove impacted cerumen from bilat ear canal. Patient tolerated procedure well. Advised they avoid using any cotton swabs or other devices to clean the ear canals.  Use basic hygiene as discussed.  Follow up prn.   Recent bisphosphonate initiation but also recent oral surgery.   Advised she c/t calcium and vitamin D supplementation, healthy diet, weight bearing exercise, but advised she cal and talk to her oral surgeon. Advised she stop Boniva until she talks to oral surgeon and then Dr. Susann GivensLalonde about next steps, possible prolia or other.   [name] was seen today for sinus , ear ache.  Diagnoses and all orders for this visit:  Acute non-recurrent frontal sinusitis  Impacted cerumen of both ears  Osteoporosis without current pathological fracture, unspecified osteoporosis type  H/O oral surgery  Other orders -     amoxicillin (AMOXIL) 875 MG tablet; Take 1 tablet (875 mg total) by mouth 2 (two) times daily.   Patient was advised to call or return if worse or not improving in the  next few days.    Patient voiced understanding of diagnosis, recommendations, and treatment plan.

## 2018-03-30 ENCOUNTER — Ambulatory Visit: Payer: 59 | Admitting: Family Medicine

## 2018-03-30 ENCOUNTER — Encounter: Payer: Self-pay | Admitting: Family Medicine

## 2018-03-30 VITALS — BP 120/78 | HR 50 | Temp 97.7°F | Wt 128.2 lb

## 2018-03-30 DIAGNOSIS — R5383 Other fatigue: Secondary | ICD-10-CM | POA: Diagnosis not present

## 2018-03-30 DIAGNOSIS — L659 Nonscarring hair loss, unspecified: Secondary | ICD-10-CM

## 2018-03-30 NOTE — Progress Notes (Signed)
   Subjective:    Patient ID: [name] EmmsKathi K Pounders, female    DOB: 03-27-1951, 67 y.o.   MRN: [id number]  HPI She is here for evaluation of difficulty with fatigue, night sweats, weight gain, hair loss, malaise but no myalgias.  No fever, chills, rash, joint swelling.  She had similar episode several years ago and was seen by Dr. Kellie Simmeringruslow.  He thought it was an inflammatory response probably from virus.  He placed her on a tapered dose of prednisone which did help. She has been under stress for the last several minutes.  Her son died back in February and there is still dealing with this but seem to be handling it fairly well.  [facility] are [facility].  Review of Systems     Objective:   Physical Exam Alert and in no distress. Tympanic membranes and canals are normal. Pharyngeal area is normal. Neck is supple without adenopathy or thyromegaly. Cardiac exam shows a regular sinus rhythm without murmurs or gallops. Lungs are clear to auscultation.  Exam of her wrists knees and elbow shows no swelling. She does not smoke and does not do street drugs.        Assessment & Plan:  Fatigue, unspecified type - Plan: CBC with Differential/Platelet, Comprehensive metabolic panel, Sedimentation rate, TSH, C-reactive protein  Hair loss - Plan: CBC with Differential/Platelet, Comprehensive metabolic panel, Sedimentation rate, TSH, C-reactive protein I will essentially pursue further evaluation and work-up as we did prior to this.  Briefly discussed the possibility of using steroids again or possibly getting another opinion.

## 2018-03-31 LAB — COMPREHENSIVE METABOLIC PANEL
ALK PHOS: 74 IU/L (ref 39–117)
ALT: 15 IU/L (ref 0–32)
AST: 17 IU/L (ref 0–40)
Albumin/Globulin Ratio: 2.1 (ref 1.2–2.2)
Albumin: 4.8 g/dL (ref 3.6–4.8)
BUN/Creatinine Ratio: 20 (ref 12–28)
BUN: 15 mg/dL (ref 8–27)
Bilirubin Total: 0.6 mg/dL (ref 0.0–1.2)
CALCIUM: 9.7 mg/dL (ref 8.7–10.3)
CHLORIDE: 102 mmol/L (ref 96–106)
CO2: 22 mmol/L (ref 20–29)
Creatinine, Ser: 0.75 mg/dL (ref 0.57–1.00)
GFR calc Af Amer: 97 mL/min/{1.73_m2} (ref >59)
GFR calc non Af Amer: 84 mL/min/{1.73_m2} (ref >59)
Globulin, Total: 2.3 g/dL (ref 1.5–4.5)
Glucose: 86 mg/dL (ref 65–99)
POTASSIUM: 5.3 mmol/L — AB (ref 3.5–5.2)
Sodium: 138 mmol/L (ref 134–144)
Total Protein: 7.1 g/dL (ref 6.0–8.5)

## 2018-03-31 LAB — CBC WITH DIFFERENTIAL/PLATELET
BASOS: 1 %
Basophils Absolute: 0 10*3/uL (ref 0.0–0.2)
EOS (ABSOLUTE): 0.1 10*3/uL (ref 0.0–0.4)
EOS: 2 %
Hematocrit: 41.9 % (ref 34.0–46.6)
Hemoglobin: 13.8 g/dL (ref 11.1–15.9)
IMMATURE GRANULOCYTES: 0 %
Immature Grans (Abs): 0 10*3/uL (ref 0.0–0.1)
LYMPHS ABS: 1.7 10*3/uL (ref 0.7–3.1)
Lymphs: 32 %
MCH: 29.8 pg (ref 26.6–33.0)
MCHC: 32.9 g/dL (ref 31.5–35.7)
MCV: 91 fL (ref 79–97)
MONOS ABS: 0.6 10*3/uL (ref 0.1–0.9)
Monocytes: 10 %
Neutrophils Absolute: 3 10*3/uL (ref 1.4–7.0)
Neutrophils: 55 %
PLATELETS: 212 10*3/uL (ref 150–450)
RBC: 4.63 x10E6/uL (ref 3.77–5.28)
RDW: 11.9 % — AB (ref 12.3–15.4)
WBC: 5.4 10*3/uL (ref 3.4–10.8)

## 2018-03-31 LAB — C-REACTIVE PROTEIN: CRP: 1 mg/L (ref 0–10)

## 2018-03-31 LAB — TSH: TSH: 1.87 u[IU]/mL (ref 0.450–4.500)

## 2018-03-31 LAB — SEDIMENTATION RATE: SED RATE: 6 mm/h (ref 0–40)

## 2018-05-15 ENCOUNTER — Other Ambulatory Visit: Payer: Self-pay | Admitting: Family Medicine

## 2018-05-15 DIAGNOSIS — M810 Age-related osteoporosis without current pathological fracture: Secondary | ICD-10-CM

## 2018-05-17 IMAGING — CT CT KNEE*R* W/O CM
3 series · 11 of 33 positions shown, 13 images · non-contrast
Comparison: None.

CLINICAL DATA: Right knee pain.  Prior patellar repair 08/01/2016.

EXAM:
CT OF THE RIGHT KNEE WITHOUT CONTRAST
TECHNIQUE: Multidetector CT imaging of the RIGHT knee was performed according
to the standard protocol. Multiplanar CT image reconstructions were
also generated.

[Series 4: lower ext 1.5 st · axial · 0.31mm/px · z∈[+366,+516]mm · 3 of 163 slices shown, 4 images]
[im 38/163  soft-tissue]
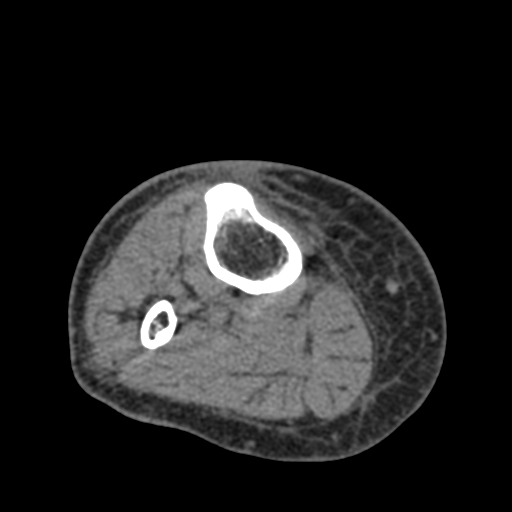
[im 38/163  bone]
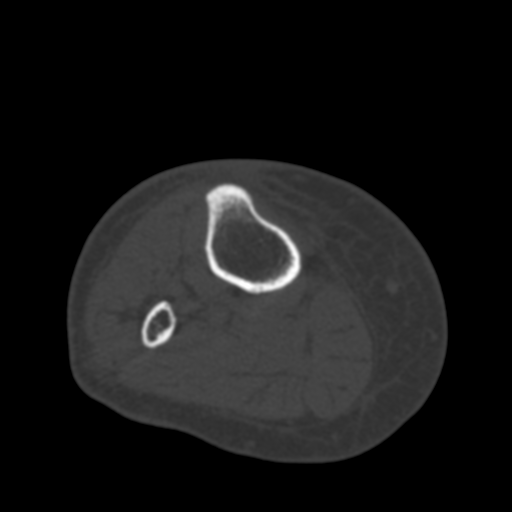
[im 88/163  bone]
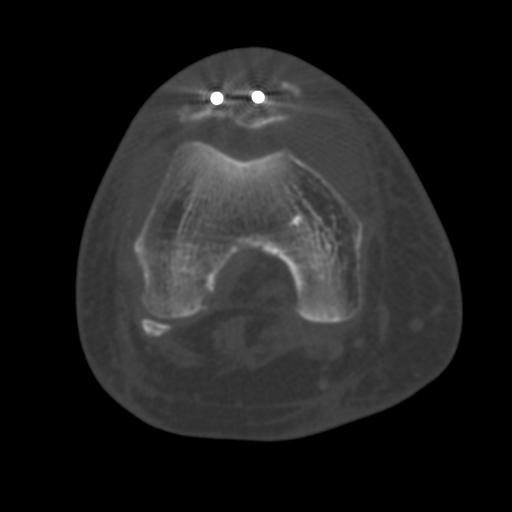
[im 138/163  bone]
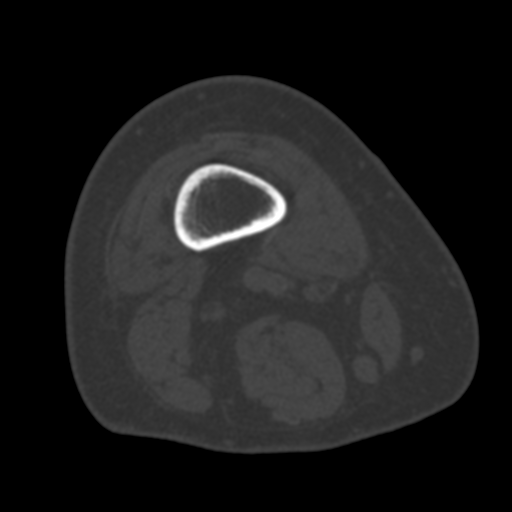

[Series 8: lower ext cor st · coronal · 0.25mm/px · 3 of 90 slices shown]
[im 18/90  bone]
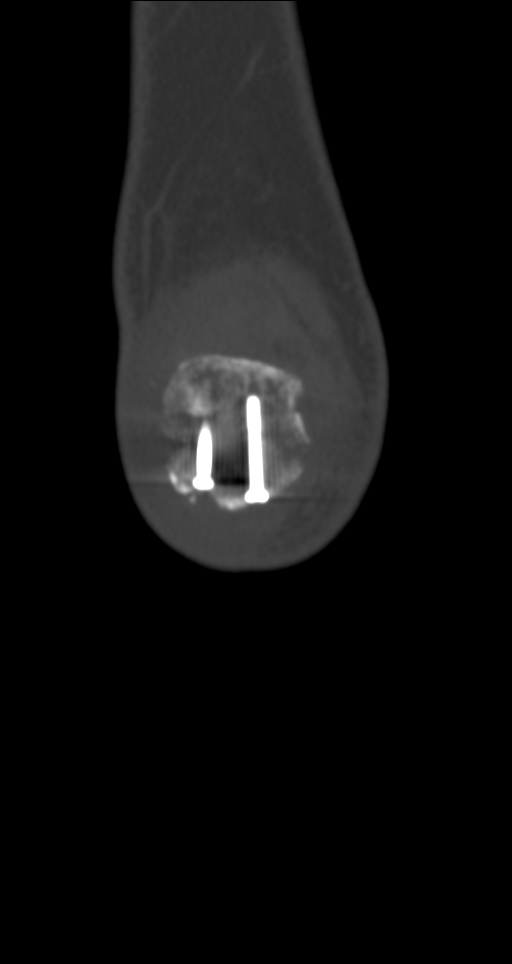
[im 36/90  bone]
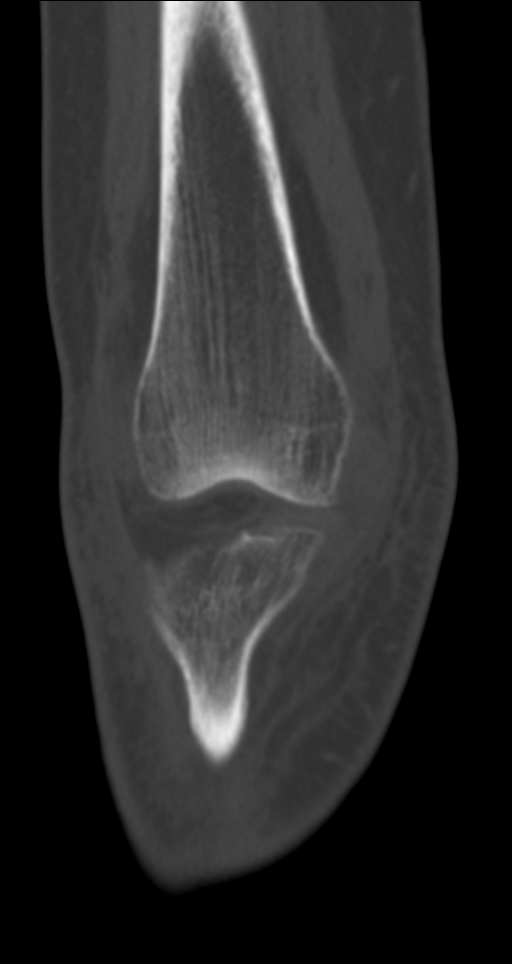
[im 54/90  bone]
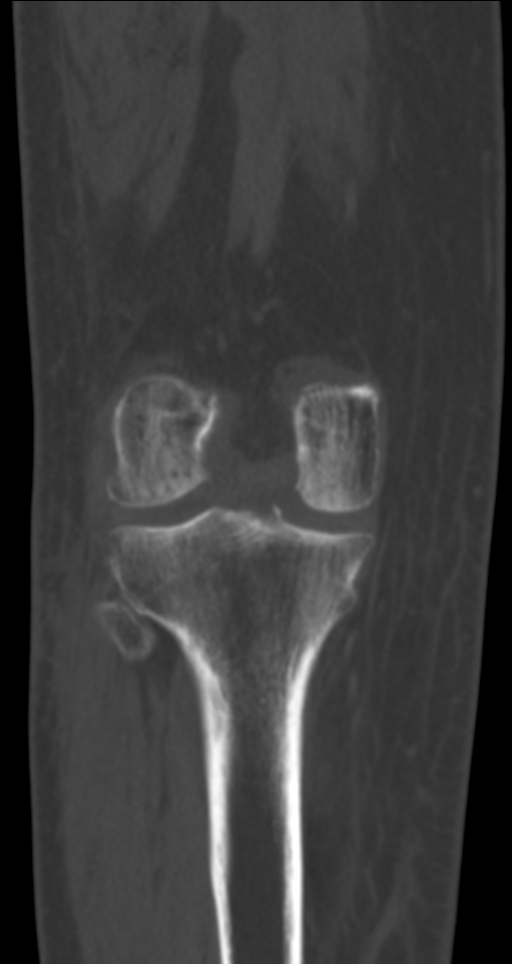

[Series 10: lower ext sag st · sagittal · 0.28mm/px · 5 of 77 slices shown, 6 images]
[im 26/77  bone]
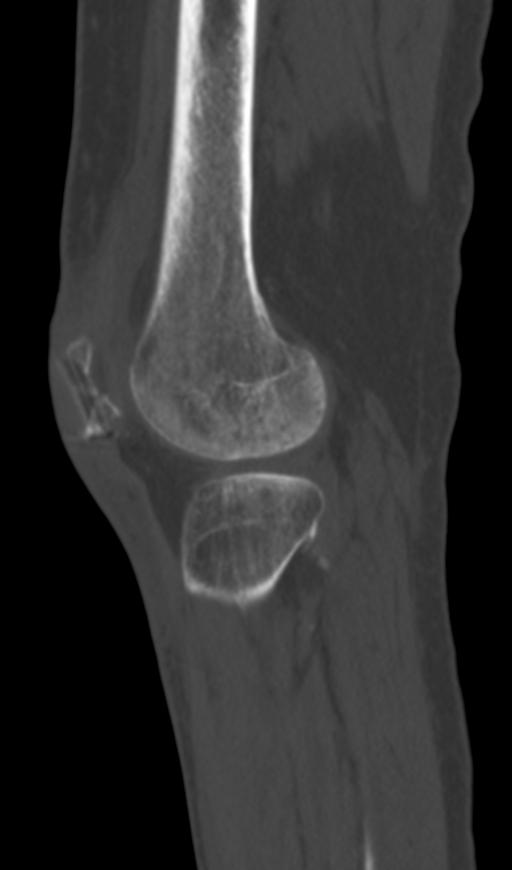
[im 32/77  bone]
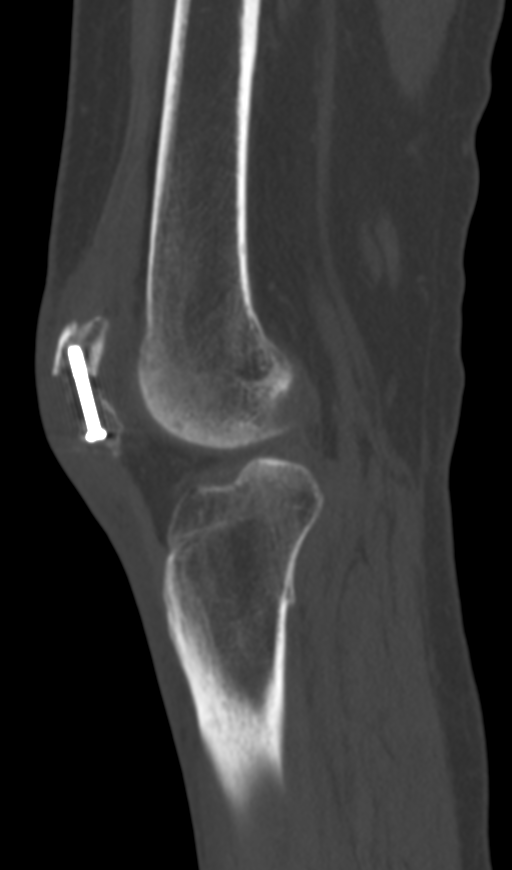
[im 39/77  soft-tissue]
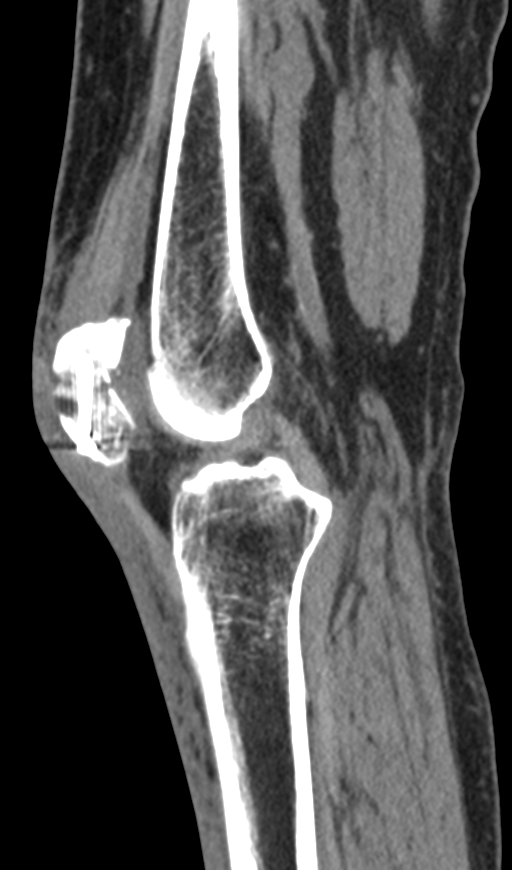
[im 39/77  bone]
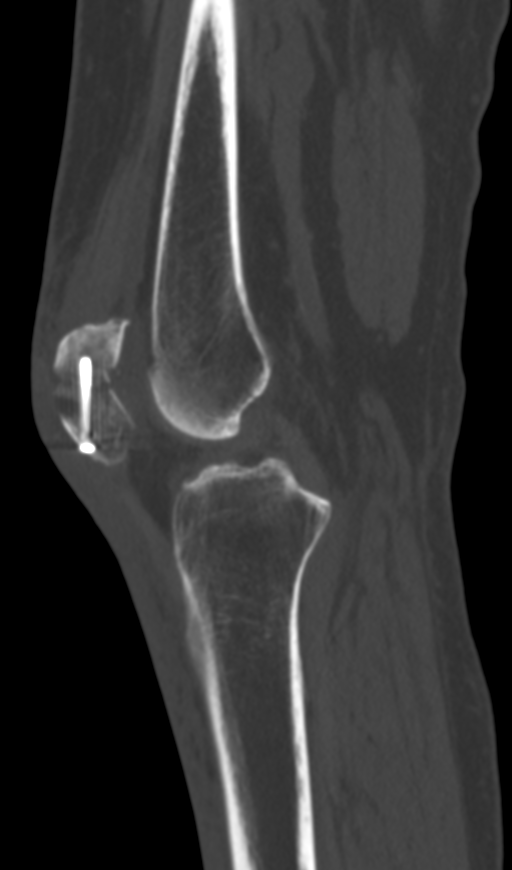
[im 45/77  bone]
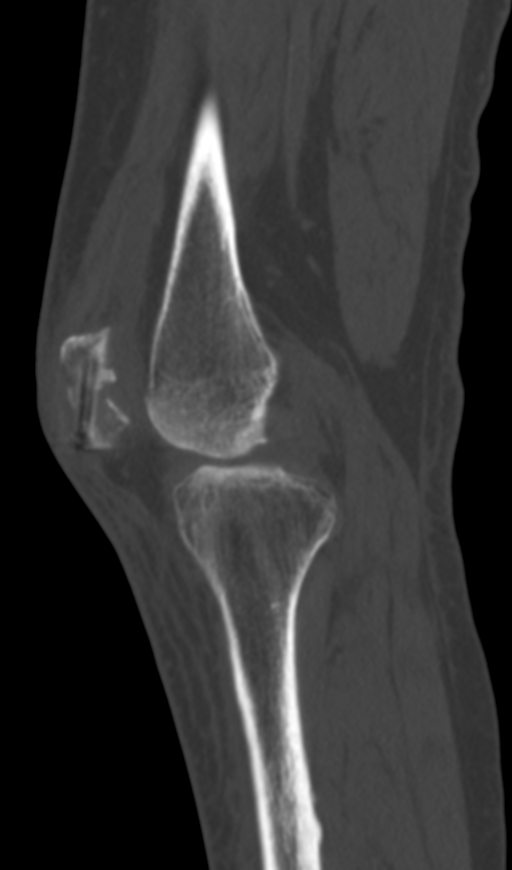
[im 51/77  bone]
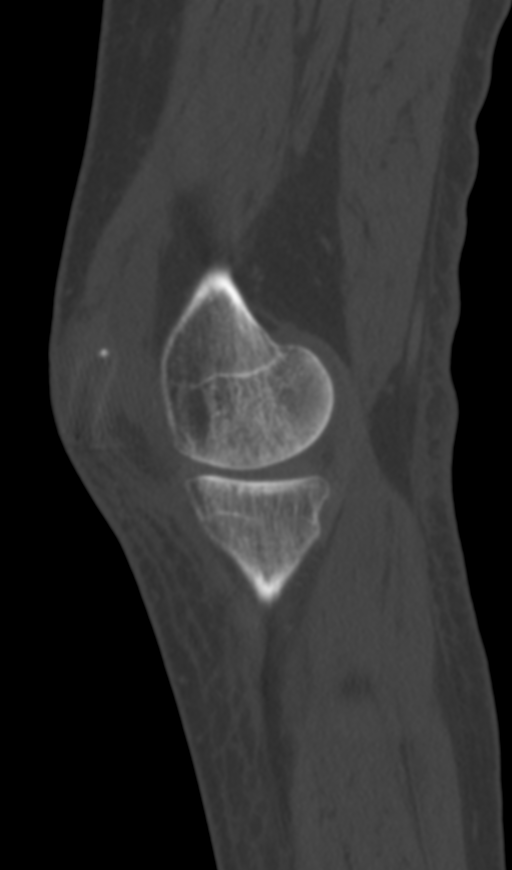

[11 of 33 positions shown; findings below may reference images not displayed]

FINDINGS: Bones/Joint/Cartilage

Generalized osteopenia.

Comminuted fracture of the patella transfixed with 2 cannulated
screws without failure or complication. 5 mm of distraction between
the major fracture fragments with articular surface involvement. No
significant surrounding callus formation.

No other acute fracture or dislocation. Large joint effusion.
Otherwise normal alignment.

Ligaments

Ligaments are suboptimally evaluated by CT. ACL and PCL are grossly
intact. MCL and LCL are grossly intact.

Muscles and Tendons
The muscles are normal. No muscle atrophy. Intact quadriceps tendon
and patellar tendon.

Soft tissue
No fluid collection or hematoma. No soft tissue mass. Mild soft
tissue edema within the subcutaneous fat along the anterior aspect
of the knee.
IMPRESSION: 1. Persisting comminuted fracture of the patella transfixed with 2
cannulated screws without failure or complication. No significant
healing across the fracture planes. 5 mm of distraction between the
major fracture fragments.

## 2018-07-05 LAB — HM COLONOSCOPY

## 2018-07-29 ENCOUNTER — Other Ambulatory Visit: Payer: Self-pay | Admitting: Obstetrics and Gynecology

## 2018-07-29 DIAGNOSIS — Z803 Family history of malignant neoplasm of breast: Secondary | ICD-10-CM

## 2018-09-13 ENCOUNTER — Ambulatory Visit: Payer: 59

## 2018-10-13 ENCOUNTER — Ambulatory Visit
Admission: RE | Admit: 2018-10-13 | Discharge: 2018-10-13 | Disposition: A | Discharge status: Home / Self Care | Payer: No Typology Code available for payment source | Source: Ambulatory Visit | Attending: Obstetrics and Gynecology | Admitting: Obstetrics and Gynecology

## 2018-10-13 DIAGNOSIS — Z803 Family history of malignant neoplasm of breast: Secondary | ICD-10-CM

## 2018-10-13 MED ORDER — GADOBUTROL 1 MMOL/ML IV SOLN
5.0000 mL | Freq: Once | INTRAVENOUS | Status: AC | PRN
  Administered 2018-10-13: 5 mL via INTRAVENOUS

## 2018-11-11 ENCOUNTER — Ambulatory Visit: Payer: 59 | Admitting: Family Medicine

## 2018-11-11 ENCOUNTER — Other Ambulatory Visit: Payer: Self-pay

## 2018-11-15 ENCOUNTER — Ambulatory Visit
Admission: RE | Admit: 2018-11-15 | Discharge: 2018-11-15 | Disposition: A | Discharge status: Home / Self Care | Payer: No Typology Code available for payment source | Source: Ambulatory Visit | Attending: Family Medicine | Admitting: Family Medicine

## 2018-11-15 ENCOUNTER — Other Ambulatory Visit: Payer: Self-pay

## 2018-11-15 ENCOUNTER — Ambulatory Visit (INDEPENDENT_AMBULATORY_CARE_PROVIDER_SITE_OTHER): Payer: 59 | Admitting: Family Medicine

## 2018-11-15 ENCOUNTER — Encounter: Payer: Self-pay | Admitting: Family Medicine

## 2018-11-15 VITALS — Wt 128.0 lb

## 2018-11-15 DIAGNOSIS — M25552 Pain in left hip: Secondary | ICD-10-CM | POA: Diagnosis not present

## 2018-11-15 NOTE — Progress Notes (Signed)
   Subjective:    Patient ID: [name] [name], female    DOB: [birth year], 68 y.o.   MRN: [id number]  HPI Documentation for virtual telephone encounter.  Documentation for virtual audio and video telecommunications through [place] encounter: The patient was located at home. The provider was located in the office. The patient did consent to this visit and is aware of possible charges through their insurance for this visit. The other persons participating in this telemedicine service were none. Time spent on call was 5 minutes and in review of previous records >15 minutes total. This virtual service is not related to other E/M service within previous 7 days. She has a 37-month history of difficulty with left hip and femur pain.  She also has been having left foot pain mainly in the arch that sometimes does wake her up at night.  She is no longer running but has been walking.  She was fitted with new shoes approximately 3 weeks ago however the pain has continued.   Review of Systems     Objective:   Physical Exam Alert and in no distress otherwise not examined       Assessment & Plan:   Encounter Diagnosis  Name Primary?  . Left hip pain Yes  I discussed the fact that the hip ,femur and foot pain can all be interconnected and related to biomechanics.  Since she changed her shoes and has no benefit, I think it is worthwhile looking at the hip.  Further evaluation and treatment will depend on what the x-ray shows.  She was comfortable with that.

## 2018-11-30 ENCOUNTER — Telehealth: Payer: Self-pay | Admitting: Family Medicine

## 2018-11-30 NOTE — Telephone Encounter (Signed)
Pt husband was advised . [place]

## 2018-11-30 NOTE — Telephone Encounter (Signed)
Please call pt [name] [name] and [name] [name] husband Jenny Reichmann, also a pt here) are traveling to [place] in September [place] is requiring anyone from [place] to have had a covid test within 72 of arrival and have the negative results   She has questions about this and also may possibly need order put in for [name] [name] and [name] [name] husband

## 2018-12-06 ENCOUNTER — Other Ambulatory Visit: Payer: Self-pay

## 2018-12-06 ENCOUNTER — Ambulatory Visit (INDEPENDENT_AMBULATORY_CARE_PROVIDER_SITE_OTHER): Payer: 59 | Admitting: Family Medicine

## 2018-12-06 VITALS — BP 128/70 | Ht 63.0 in | Wt 124.0 lb

## 2018-12-06 DIAGNOSIS — R269 Unspecified abnormalities of gait and mobility: Secondary | ICD-10-CM

## 2018-12-07 ENCOUNTER — Encounter: Payer: Self-pay | Admitting: Family Medicine

## 2018-12-07 NOTE — Progress Notes (Signed)
PCP: Denita Lung, MD  Subjective:   HPI: Patient is a 68 y.o. female here for custom orthotics.  Patient reports she's had custom orthotics before - had issues with foot pain remotely and had orthotics made 16 years ago. These held up well but other orthopedic issues have led to her not running any longer (had a patellar fracture and told not to run). No current pain or complaints. No skin changes, numbness.  Past Medical History:  Diagnosis Date  . Glaucoma   . Hyperlipidemia   . Osteopenia     Current Outpatient Medications on File Prior to Visit  Medication Sig Dispense Refill  . aspirin EC 81 MG tablet Take 81 mg by mouth daily.    . Calcium-Vitamin D (CALTRATE 600 PLUS-VIT D PO) Take 1 tablet by mouth daily.     Marland Kitchen ibandronate (BONIVA) 150 MG tablet TAKE 1 TABLET BY MOUTH EVERY 30 DAYS 3 tablet 3  . ketorolac (ACULAR) 0.4 % SOLN INT 1 GTT IN OS QID STARTING 1 DAY B SURGERY    . latanoprost (XALATAN) 0.005 % ophthalmic solution Place 1 drop into both eyes at bedtime.     . Multiple Vitamin (MULTIVITAMIN) capsule Take 1 capsule by mouth daily.    . prednisoLONE acetate (PRED FORTE) 1 % ophthalmic suspension SHAKE LQ AND INT 1 GTT IN OS QID AFTER SURGERY    . tretinoin (RETIN-A) 0.1 % cream APP EXT AA Q NIGHT    . zolpidem (AMBIEN) 5 MG tablet take 1 tablet by mouth at bedtime if needed for sleep 15 tablet 1   No current facility-administered medications on file prior to visit.     History reviewed. No pertinent surgical history.  No Known Allergies  Social History   Socioeconomic History  . Marital status: Married    Spouse name: Not on file  . Number of children: Not on file  . Years of education: Not on file  . Highest education level: Not on file  Occupational History  . Not on file  Social Needs  . Financial resource strain: Not on file  . Food insecurity    Worry: Not on file    Inability: Not on file  . Transportation needs    Medical: Not on file   Non-medical: Not on file  Tobacco Use  . Smoking status: Never Smoker  . Smokeless tobacco: Never Used  Substance and Sexual Activity  . Alcohol use: Yes    Alcohol/week: 0.0 standard drinks    Comment: 2-3/week  . Drug use: No  . Sexual activity: Not on file  Lifestyle  . Physical activity    Days per week: Not on file    Minutes per session: Not on file  . Stress: Not on file  Relationships  . Social Herbalist on phone: Not on file    Gets together: Not on file    Attends religious service: Not on file    Active member of club or organization: Not on file    Attends meetings of clubs or organizations: Not on file    Relationship status: Not on file  . Intimate partner violence    Fear of current or ex partner: Not on file    Emotionally abused: Not on file    Physically abused: Not on file    Forced sexual activity: Not on file  Other Topics Concern  . Not on file  Social History Narrative  . Not on file  History reviewed. No pertinent family history.  BP 128/70   Ht 5\' 3"  (1.6 m)   Wt 124 lb (56.2 kg)   BMI 21.97 kg/m   Review of Systems: See HPI above.     Objective:  Physical Exam:  Gen: NAD, comfortable in exam room  Right foot/ankle: Smaller length and width of foot compared to left.  Splaying 1st and 2nd digits with transverse arch collapse but no plantar callus. FROM digits and ankle with 5/5 strength. No TTP Negative ant drawer and talar tilt.   Thompsons test negative. NV intact distally.  Left foot/ankle: No gross deformity, swelling, ecchymoses.  Transverse arch collapse but no plantar callus. FROM digits and ankle with 5/5 strength. No TTP Negative ant drawer and talar tilt.   Thompsons test negative. NV intact distally.  No leg length inequality. Gait:  Mild outturning of right foot compared to left.  No other abnormalities.  Assessment & Plan:  1. Gait abnormality - did well previously with custom orthotics - new ones  made today and felt comfortable.  Tylenol if needed should she develop pain.  Consider metatarsal pads but not having any issues currently with metatarsalgia.  F/u prn.  Patient was fitted for a : standard, cushioned, semi-rigid orthotic. The orthotic was heated and afterward the patient stood on the orthotic blank positioned on the orthotic stand. The patient was positioned in subtalar neutral position and 10 degrees of ankle dorsiflexion in a weight bearing stance. After completion of molding, a stable base was applied to the orthotic blank. The blank was ground to a stable position for weight bearing. Size:6 red cambray Base: blue med density eva Posting: none Additional orthotic padding: none

## 2018-12-10 ENCOUNTER — Telehealth: Payer: Self-pay | Admitting: Family Medicine

## 2018-12-10 NOTE — Telephone Encounter (Signed)
Pt called and states she continues to have issues. She saw JCl in July. Pt wants to know what she needs to do now. Pt can be reached at [phone].

## 2019-02-16 ENCOUNTER — Other Ambulatory Visit: Payer: Self-pay

## 2019-02-16 ENCOUNTER — Ambulatory Visit: Payer: 59 | Admitting: Family Medicine

## 2019-02-16 VITALS — BP 120/74 | Ht 63.0 in | Wt 124.0 lb

## 2019-02-16 DIAGNOSIS — M79672 Pain in left foot: Secondary | ICD-10-CM

## 2019-02-16 DIAGNOSIS — M25552 Pain in left hip: Secondary | ICD-10-CM

## 2019-02-16 NOTE — Patient Instructions (Signed)
You're getting spasms of one of the intrinsic muscles of your foot. Continue with the orthotics during the day. Do home exercises we reviewed 3 sets of 10 once a day (towel pulls, marble pick-ups, drop/raise on a step). Consider physical therapy if not improving. Tylenol, biofreeze if needed.  You also have pain in the glut medius muscle - the rest of your exam is reassuring. Start physical therapy for this and do home exercises on days you don't go to therapy. Follow up with me in 5-6 weeks.

## 2019-02-17 ENCOUNTER — Encounter: Payer: Self-pay | Admitting: Family Medicine

## 2019-02-17 NOTE — Progress Notes (Signed)
PCP: Ronnald Nian, MD  Subjective:   HPI: Patient is a 68 y.o. female here for left foot, hip pain.  Patient returns reporting about 2 months of plantar left foot pain. Essentially only bothers her about 3-4am when she feels pain in her left arch causing her to get up and walk around. Comes up medial ankle as well. Does not bother her during the day. No swelling or bruising. Orthotics do not seem to have helped with this. Has tried rolling on a ball, using icyhot. Her left hip pain started when she picked up a basket and felt a crunch posterior left hip. Was very loud with pain in left side of posterior hip/back. No radiation down leg. No numbness/tingling.  Past Medical History:  Diagnosis Date  . Glaucoma   . Hyperlipidemia   . Osteopenia     Current Outpatient Medications on File Prior to Visit  Medication Sig Dispense Refill  . aspirin EC 81 MG tablet Take 81 mg by mouth daily.    . Calcium-Vitamin D (CALTRATE 600 PLUS-VIT D PO) Take 1 tablet by mouth daily.     . dorzolamide-timolol (COSOPT) 22.3-6.8 MG/ML ophthalmic solution INT 1 GTT IN OU BID    . ibandronate (BONIVA) 150 MG tablet TAKE 1 TABLET BY MOUTH EVERY 30 DAYS 3 tablet 3  . latanoprost (XALATAN) 0.005 % ophthalmic solution Place 1 drop into both eyes at bedtime.     . Multiple Vitamin (MULTIVITAMIN) capsule Take 1 capsule by mouth daily.    Marland Kitchen tretinoin (RETIN-A) 0.1 % cream APP EXT AA Q NIGHT    . ketorolac (ACULAR) 0.4 % SOLN INT 1 GTT IN OS QID STARTING 1 DAY B SURGERY    . prednisoLONE acetate (PRED FORTE) 1 % ophthalmic suspension SHAKE LQ AND INT 1 GTT IN OS QID AFTER SURGERY    . zolpidem (AMBIEN) 5 MG tablet take 1 tablet by mouth at bedtime if needed for sleep 15 tablet 1   No current facility-administered medications on file prior to visit.     History reviewed. No pertinent surgical history.  No Known Allergies  Social History   Socioeconomic History  . Marital status: Married    Spouse  name: Not on file  . Number of children: Not on file  . Years of education: Not on file  . Highest education level: Not on file  Occupational History  . Not on file  Social Needs  . Financial resource strain: Not on file  . Food insecurity    Worry: Not on file    Inability: Not on file  . Transportation needs    Medical: Not on file    Non-medical: Not on file  Tobacco Use  . Smoking status: Never Smoker  . Smokeless tobacco: Never Used  Substance and Sexual Activity  . Alcohol use: Yes    Alcohol/week: 0.0 standard drinks    Comment: 2-3/week  . Drug use: No  . Sexual activity: Not on file  Lifestyle  . Physical activity    Days per week: Not on file    Minutes per session: Not on file  . Stress: Not on file  Relationships  . Social Musician on phone: Not on file    Gets together: Not on file    Attends religious service: Not on file    Active member of club or organization: Not on file    Attends meetings of clubs or organizations: Not on file  Relationship status: Not on file  . Intimate partner violence    Fear of current or ex partner: Not on file    Emotionally abused: Not on file    Physically abused: Not on file    Forced sexual activity: Not on file  Other Topics Concern  . Not on file  Social History Narrative  . Not on file    History reviewed. No pertinent family history.  BP 120/74   Ht 5\' 3"  (1.6 m)   Wt 124 lb (56.2 kg)   BMI 21.97 kg/m   Review of Systems: See HPI above.     Objective:  Physical Exam:  Gen: NAD, comfortable in exam room  Left foot/ankle: No gross deformity, swelling, ecchymoses FROM with 5/5 strength all directions No TTP Negative ant drawer and talar tilt.   Thompsons test negative. NV intact distally.  Back: No gross deformity, scoliosis, instability. TTP over glut medius.  No TTP SI joint, greater trochanter.  No midline or bony TTP. FROM. Strength LEs 5/5 all muscle groups.   Negative  SLRs. Sensation intact to light touch bilaterally.  Left hip: No deformity, instability. FROM with 5/5 strength. No tenderness to palpation. NVI distally. Negative logroll bilateral hips Negative fabers and piriformis stretches.    Assessment & Plan:  1. Left foot pain - describes spasms in middle of the night within intrinsic muscles of foot though exam normal currently.  Tylenol, biofreeze if needed.  Continue with orthotics - do not think adjustments need to be made on evaluation of foot and orthotic.  Shown strengthening exercises to do daily.    2. Left hip pain - localized to about glut medius though strength grossly is good and rest of exam is reassuring.  Start physical therapy with home exercise program.

## 2019-03-27 ENCOUNTER — Encounter: Payer: Self-pay | Admitting: Family Medicine

## 2019-03-28 ENCOUNTER — Ambulatory Visit: Payer: 59 | Admitting: Family Medicine

## 2019-03-28 ENCOUNTER — Encounter: Payer: Self-pay | Admitting: Family Medicine

## 2019-03-28 ENCOUNTER — Other Ambulatory Visit: Payer: Self-pay

## 2019-03-28 VITALS — BP 136/84 | HR 54 | Temp 97.1°F | Wt 136.0 lb

## 2019-03-28 DIAGNOSIS — I7300 Raynaud's syndrome without gangrene: Secondary | ICD-10-CM

## 2019-03-28 DIAGNOSIS — M810 Age-related osteoporosis without current pathological fracture: Secondary | ICD-10-CM | POA: Diagnosis not present

## 2019-03-28 NOTE — Progress Notes (Signed)
   Subjective:    Patient ID: [name] [name], female    DOB: 08-01-1950, 68 y.o.   MRN: [id number]  HPI She is here for consult concerning difficulty with her left ring finger.  She noted the onset of the tip of the finger becoming white and cold.  She then used warm water on this and did turn kind of purplish.  This occurred several days ago.  She has no previous history of cold sensitivity or having this occur prior to this 1 visit.  She is up-to-date on all of her health maintenance.  Review of her record does indicate that she did have an episode of erythema multiforme several years ago but is not had any symptoms since then.  She has had no fever, chills, weight loss, skin or hair changes, abdominal pain.  No new medications.  She does not do street drugs.   Review of Systems     Objective:   Physical Exam Alert and in no distress. Tympanic membranes and canals are normal. Pharyngeal area is normal. Neck is supple without adenopathy or thyromegaly. Cardiac exam shows a regular sinus rhythm without murmurs or gallops. Lungs are clear to auscultation. The left ring finger is reddish-purple in appearance.  The rest of the digits are normal.  No skin changes.  Her fingernails appear normal.  No splinter hemorrhages or petechiae.  DTRs are normal.  Abdominal exam shows no hepatosplenomegaly, masses or tenderness. She does have a history of osteoporosis and has been on Boniva for 2 years.      Assessment & Plan:  Raynaud's phenomenon without gangrene - Plan: CBC with Differential, Comprehensive metabolic panel, Lipid panel, Sedimentation rate, TSH  Osteoporosis without current pathological fracture, unspecified osteoporosis type - Plan: DG Bone Density I explained that this could be disease or phenomenon but unclear at the present time since she is only had 1 of these episodes.  I will do blood work to screen her.  This weekend will be cold so it will be interesting to see if anything shows up.   She will keep me informed.  Explained the fact that we might need to send her to rheumatology.

## 2019-03-29 LAB — CBC WITH DIFFERENTIAL/PLATELET
Basophils Absolute: 0.1 10*3/uL (ref 0.0–0.2)
Basos: 1 %
EOS (ABSOLUTE): 0.1 10*3/uL (ref 0.0–0.4)
Eos: 2 %
Hematocrit: 39.3 % (ref 34.0–46.6)
Hemoglobin: 13.2 g/dL (ref 11.1–15.9)
Immature Grans (Abs): 0 10*3/uL (ref 0.0–0.1)
Immature Granulocytes: 0 %
Lymphocytes Absolute: 1.9 10*3/uL (ref 0.7–3.1)
Lymphs: 34 %
MCH: 30.5 pg (ref 26.6–33.0)
MCHC: 33.6 g/dL (ref 31.5–35.7)
MCV: 91 fL (ref 79–97)
Monocytes Absolute: 0.6 10*3/uL (ref 0.1–0.9)
Monocytes: 10 %
Neutrophils Absolute: 3.1 10*3/uL (ref 1.4–7.0)
Neutrophils: 53 %
Platelets: 197 10*3/uL (ref 150–450)
RBC: 4.33 x10E6/uL (ref 3.77–5.28)
RDW: 11.7 % (ref 11.7–15.4)
WBC: 5.7 10*3/uL (ref 3.4–10.8)

## 2019-03-29 LAB — COMPREHENSIVE METABOLIC PANEL
ALT: 13 IU/L (ref 0–32)
AST: 20 IU/L (ref 0–40)
Albumin/Globulin Ratio: 2.4 — ABNORMAL HIGH (ref 1.2–2.2)
Albumin: 4.5 g/dL (ref 3.8–4.8)
Alkaline Phosphatase: 61 IU/L (ref 39–117)
BUN/Creatinine Ratio: 27 (ref 12–28)
BUN: 19 mg/dL (ref 8–27)
Bilirubin Total: 0.5 mg/dL (ref 0.0–1.2)
CO2: 22 mmol/L (ref 20–29)
Calcium: 9.5 mg/dL (ref 8.7–10.3)
Chloride: 102 mmol/L (ref 96–106)
Creatinine, Ser: 0.7 mg/dL (ref 0.57–1.00)
GFR calc Af Amer: 104 mL/min/{1.73_m2} (ref >59)
GFR calc non Af Amer: 91 mL/min/{1.73_m2} (ref >59)
Globulin, Total: 1.9 g/dL (ref 1.5–4.5)
Glucose: 83 mg/dL (ref 65–99)
Potassium: 4.7 mmol/L (ref 3.5–5.2)
Sodium: 139 mmol/L (ref 134–144)
Total Protein: 6.4 g/dL (ref 6.0–8.5)

## 2019-03-29 LAB — SEDIMENTATION RATE: Sed Rate: 6 mm/hr (ref 0–40)

## 2019-03-29 LAB — LIPID PANEL
Chol/HDL Ratio: 3.6 ratio (ref 0.0–4.4)
Cholesterol, Total: 247 mg/dL — ABNORMAL HIGH (ref 100–199)
HDL: 69 mg/dL (ref >39)
LDL Chol Calc (NIH): 154 mg/dL — ABNORMAL HIGH (ref 0–99)
Triglycerides: 138 mg/dL (ref 0–149)
VLDL Cholesterol Cal: 24 mg/dL (ref 5–40)

## 2019-03-29 LAB — TSH: TSH: 1.21 u[IU]/mL (ref 0.450–4.500)

## 2019-05-10 ENCOUNTER — Ambulatory Visit
Admission: RE | Admit: 2019-05-10 | Discharge: 2019-05-10 | Disposition: A | Discharge status: Home / Self Care | Payer: 59 | Source: Ambulatory Visit | Attending: Family Medicine | Admitting: Family Medicine

## 2019-05-10 ENCOUNTER — Other Ambulatory Visit: Payer: Self-pay

## 2019-05-10 DIAGNOSIS — M810 Age-related osteoporosis without current pathological fracture: Secondary | ICD-10-CM

## 2019-05-14 ENCOUNTER — Encounter: Payer: Self-pay | Admitting: Family Medicine

## 2019-05-18 ENCOUNTER — Other Ambulatory Visit: Payer: Self-pay | Admitting: Family Medicine

## 2019-05-18 DIAGNOSIS — M810 Age-related osteoporosis without current pathological fracture: Secondary | ICD-10-CM

## 2019-05-25 ENCOUNTER — Inpatient Hospital Stay: Admission: RE | Admit: 2019-05-25 | Payer: 59 | Source: Ambulatory Visit

## 2019-05-27 ENCOUNTER — Encounter: Payer: Self-pay | Admitting: Family Medicine

## 2019-05-30 LAB — HM DEXA SCAN

## 2019-06-02 ENCOUNTER — Encounter: Payer: Self-pay | Admitting: *Deleted

## 2019-06-06 NOTE — Progress Notes (Signed)
Called pt to advise dr.lalonde would like for her to start prolia due to dexa scan score.

## 2019-06-12 ENCOUNTER — Ambulatory Visit: Payer: 59 | Attending: Internal Medicine

## 2019-06-12 DIAGNOSIS — Z23 Encounter for immunization: Secondary | ICD-10-CM | POA: Insufficient documentation

## 2019-06-12 NOTE — Progress Notes (Signed)
   Covid-19 Vaccination Clinic  Name:  SEAIRA BYUS    MRN: [id number] DOB: [birth year]  06/12/2019  Ms. Koller was observed post Covid-19 immunization for 15 minutes without incidence. She was provided with Vaccine Information Sheet and instruction to access the V-Safe system.   Ms. Trulson was instructed to call 911 with any severe reactions post vaccine: Marland Kitchen Difficulty breathing  . Swelling of your face and throat  . A fast heartbeat  . A bad rash all over your body  . Dizziness and weakness    Immunizations Administered    Name Date Dose VIS Date Route   Pfizer COVID-19 Vaccine 06/12/2019 10:36 AM 0.3 mL 04/08/2019 Intramuscular   Manufacturer: [employer], [employer]   Lot: [id number]   NDC: [address]-8

## 2019-06-23 ENCOUNTER — Telehealth: Payer: Self-pay | Admitting: Family Medicine

## 2019-06-23 NOTE — Telephone Encounter (Signed)
no

## 2019-06-23 NOTE — Telephone Encounter (Signed)
Pt called and states that she is schedule to take her Boniva on Tuesday of next week. She is scheduled to come in here on Thursday of next week to receive her first Prolia. Pt wants to know if she should still take her Boniva on Tuesday. Please pt at [phone] or let me know and I will advise pt.

## 2019-06-23 NOTE — Telephone Encounter (Signed)
Left message informing pt. 

## 2019-06-30 ENCOUNTER — Other Ambulatory Visit: Payer: 59

## 2019-07-05 ENCOUNTER — Ambulatory Visit: Payer: 59 | Attending: Internal Medicine

## 2019-07-05 DIAGNOSIS — Z23 Encounter for immunization: Secondary | ICD-10-CM

## 2019-07-05 NOTE — Progress Notes (Signed)
   Covid-19 Vaccination Clinic  Name:  [name] [name]    MRN: [id number] DOB: 1951/04/09  07/05/2019  [name] was observed post Covid-19 immunization for 15 minutes without incident. She was provided with Vaccine Information Sheet and instruction to access the V-Safe system.   [name] was instructed to call 911 with any severe reactions post vaccine: Marland Kitchen Difficulty breathing  . Swelling of face and throat  . A fast heartbeat  . A bad rash all over body  . Dizziness and weakness   Immunizations Administered    Name Date Dose VIS Date Route   Pfizer COVID-19 Vaccine 07/05/2019  4:37 PM 0.3 mL 04/08/2019 Intramuscular   Manufacturer: [employer], [employer]   Lot: [id number]   NDC: [address]-2

## 2019-07-22 ENCOUNTER — Other Ambulatory Visit: Payer: Self-pay

## 2019-07-22 ENCOUNTER — Other Ambulatory Visit: Payer: 59

## 2019-07-22 DIAGNOSIS — M810 Age-related osteoporosis without current pathological fracture: Secondary | ICD-10-CM

## 2019-07-22 MED ORDER — DENOSUMAB 60 MG/ML ~~LOC~~ SOSY
60.0000 mg | PREFILLED_SYRINGE | Freq: Once | SUBCUTANEOUS | Status: AC
  Administered 2019-07-22: 60 mg via SUBCUTANEOUS

## 2019-11-17 ENCOUNTER — Encounter: Payer: Self-pay | Admitting: Family Medicine

## 2019-11-17 ENCOUNTER — Ambulatory Visit: Payer: Medicare Other | Admitting: Family Medicine

## 2019-11-17 ENCOUNTER — Other Ambulatory Visit: Payer: Self-pay

## 2019-11-17 VITALS — BP 150/82 | HR 56 | Temp 97.8°F | Wt 129.6 lb

## 2019-11-17 DIAGNOSIS — M461 Sacroiliitis, not elsewhere classified: Secondary | ICD-10-CM

## 2019-11-17 DIAGNOSIS — Z7189 Other specified counseling: Secondary | ICD-10-CM | POA: Diagnosis not present

## 2019-11-17 DIAGNOSIS — M549 Dorsalgia, unspecified: Secondary | ICD-10-CM | POA: Diagnosis not present

## 2019-11-17 DIAGNOSIS — L659 Nonscarring hair loss, unspecified: Secondary | ICD-10-CM | POA: Diagnosis not present

## 2019-11-17 DIAGNOSIS — M810 Age-related osteoporosis without current pathological fracture: Secondary | ICD-10-CM | POA: Diagnosis not present

## 2019-11-17 NOTE — Progress Notes (Signed)
   Subjective:    Patient ID: [name] [name], female    DOB: 1950/05/26, 69 y.o.   MRN: [id number]  HPI She is here for consult concerning multiple issues.  She does have underlying osteoporosis and now is taking Prolia.  She was on Boniva before that.  Since starting the Prolia she has noted some difficulty with mid back discomfort as well as in the SI joint area.  She is not sure whether this is related to her Prolia.  She also has had the need for orthotics.  While she was there Dr. Pearletha Forge also recommended that she go to physical therapy.  They did use dry needling on her which did help with her aches and pains and she would like to return to that.  She has noted some slight weight gain and has concerns over that.  She does do regular exercise and involved in a program called pure bar which is apparently a very good exercise and stretching regimen.  She does occasionally have difficulty with sleep and did have Ambien in 2017.  She is reluctant to use this as she did lose a son about 2 years ago from drug overdose.  She did become tearful during the encounter.  She admits to not fully processing this.  She did use hospice bereavement services.  She also has concerns concerning hair loss.   Review of Systems     Objective:   Physical Exam Alert and in no distress.  No palpable tenderness to the mid back area.  Slight tenderness over both SI joints.  FABER testing positive.  Negative straight leg raising.  Normal hip motion.       Assessment & Plan:  Osteoporosis without current pathological fracture, unspecified osteoporosis type  Hair loss  Bereavement counseling  Musculoskeletal back pain - Plan: Ambulatory referral to Physical Therapy  Sacroiliitis (HCC) Discussed the Prolia in regard to the musculoskeletal aches and pains and swelling encouraged her to use Tylenol on an as-needed basis.  Explained that the risk versus the benefit is such that Prolia is definitely worth staying on.   Discussed hair loss with her in regard to hair loss versus gain and recent blood work which was negative.  No further intervention at this point. Recommend she follow-up with Darryl Hyers concerning bereavement counseling. Will refer her back to physical therapy for further PT and dry needling. Over 30 minutes spent discussing all the above issues and reviewing her medical record.

## 2020-01-11 ENCOUNTER — Telehealth: Payer: Self-pay

## 2020-01-11 NOTE — Telephone Encounter (Signed)
Ok for both

## 2020-01-11 NOTE — Telephone Encounter (Signed)
Pt. Called wanting to schedule her Prolia shot and also a flu shot she wanted to know if she could get those at the same time or did she need to wait a certain amount of time between shots. I told her I would have to ask Olegario Messier about her prolia shot to make sure everything was approved for that one before scheduling but would ask you about flu shot.

## 2020-01-16 ENCOUNTER — Telehealth: Payer: Self-pay | Admitting: Family Medicine

## 2020-01-16 NOTE — Telephone Encounter (Signed)
There should not be any problem with the 2 drugs and when you get so whenever she wants she can do it

## 2020-01-16 NOTE — Telephone Encounter (Signed)
Pt called and is going to [place] in November. She would like COVID booster prior to that trip. She is scheduled to have Prolia this month. When should she have COVID booster?

## 2020-01-18 ENCOUNTER — Encounter: Payer: Self-pay | Admitting: Family Medicine

## 2020-01-23 ENCOUNTER — Other Ambulatory Visit: Payer: Self-pay

## 2020-01-23 ENCOUNTER — Other Ambulatory Visit: Payer: Medicare Other

## 2020-01-23 DIAGNOSIS — Z23 Encounter for immunization: Secondary | ICD-10-CM

## 2020-01-23 DIAGNOSIS — M810 Age-related osteoporosis without current pathological fracture: Secondary | ICD-10-CM | POA: Diagnosis not present

## 2020-01-23 MED ORDER — DENOSUMAB 60 MG/ML ~~LOC~~ SOSY
60.0000 mg | PREFILLED_SYRINGE | Freq: Once | SUBCUTANEOUS | Status: AC
  Administered 2020-01-23: 60 mg via SUBCUTANEOUS

## 2020-02-13 ENCOUNTER — Ambulatory Visit: Payer: Medicare Other | Admitting: Medical

## 2020-02-13 ENCOUNTER — Encounter: Payer: Self-pay | Admitting: Medical

## 2020-02-13 ENCOUNTER — Other Ambulatory Visit: Payer: Self-pay

## 2020-02-13 VITALS — BP 136/78 | HR 49 | Ht 62.5 in | Wt 131.0 lb

## 2020-02-13 DIAGNOSIS — H9202 Otalgia, left ear: Secondary | ICD-10-CM

## 2020-02-13 DIAGNOSIS — M26622 Arthralgia of left temporomandibular joint: Secondary | ICD-10-CM

## 2020-02-13 DIAGNOSIS — H6120 Impacted cerumen, unspecified ear: Secondary | ICD-10-CM

## 2020-02-13 NOTE — Patient Instructions (Signed)
We discussed the possibility of eustachian tube dysfunction versus TMJ syndrome.  There is some color change of the eardrum on the left and fluid suggestive of eustachian tube dysfunction.  You are also tender over the TMJ and jaw  Recommendations: Begin one of the following options to help decrease pressure in the eustachian tubes  Flonase nasal spray daily for the next week which reduces inflammation and swelling in the sinuses and eustachian tubes  Over-the-counter antihistamine such as Zyrtec or Allegra at night for the next 1 to 2 weeks  Or Sudafed decongestant over-the-counter for the next 5 to 7 days  Drink plenty of water  Swallow often to release pressure in the eustachian tubes  Temporomandibular joint syndrome  Try to talk less and chew gum less for the next week to rest the jaw and TMJ  Consider pool therapy or ice pack to the jaw for 20 minutes at a time  You can use over-the-counter ibuprofen or Aleve twice daily for the next 5 to 7 days as needed  If not much improved within the next week let me know     Eustachian Tube Dysfunction  Eustachian tube dysfunction refers to a condition in which a blockage develops in the narrow passage that connects the middle ear to the back of the nose (eustachian tube). The eustachian tube regulates air pressure in the middle ear by letting air move between the ear and nose. It also helps to drain fluid from the middle ear space. Eustachian tube dysfunction can affect one or both ears. When the eustachian tube does not function properly, air pressure, fluid, or both can build up in the middle ear. What are the causes? This condition occurs when the eustachian tube becomes blocked or cannot open normally. Common causes of this condition include:  Ear infections.  Colds and other infections that affect the nose, mouth, and throat (upper respiratory tract).  Allergies.  Irritation from cigarette smoke.  Irritation from stomach  acid coming up into the esophagus (gastroesophageal reflux). The esophagus is the tube that carries food from the mouth to the stomach.  Sudden changes in air pressure, such as from descending in an airplane or scuba diving.  Abnormal growths in the nose or throat, such as: ? Growths that line the nose (nasal polyps). ? Abnormal growth of cells (tumors). ? Enlarged tissue at the back of the throat (adenoids). What increases the risk? You are more likely to develop this condition if:  You smoke.  You are overweight.  You are a child who has: ? Certain birth defects of the mouth, such as cleft palate. ? Large tonsils or adenoids. What are the signs or symptoms? Common symptoms of this condition include:  A feeling of fullness in the ear.  Ear pain.  Clicking or popping noises in the ear.  Ringing in the ear.  Hearing loss.  Loss of balance.  Dizziness. Symptoms may get worse when the air pressure around you changes, such as when you travel to an area of high elevation, fly on an airplane, or go scuba diving. How is this diagnosed? This condition may be diagnosed based on:  Your symptoms.  A physical exam of your ears, nose, and throat.  Tests, such as those that measure: ? The movement of your eardrum (tympanogram). ? Your hearing (audiometry). How is this treated? Treatment depends on the cause and severity of your condition.  In mild cases, you may relieve your symptoms by moving air into your ears.  This is called "popping the ears."  In more severe cases, or if you have symptoms of fluid in your ears, treatment may include: ? Medicines to relieve congestion (decongestants). ? Medicines that treat allergies (antihistamines). ? Nasal sprays or ear drops that contain medicines that reduce swelling (steroids). ? A procedure to drain the fluid in your eardrum (myringotomy). In this procedure, a small tube is placed in the eardrum to:  Drain the fluid.  Restore  the air in the middle ear space. ? A procedure to insert a balloon device through the nose to inflate the opening of the eustachian tube (balloon dilation). Follow these instructions at home: Lifestyle  Do not do any of the following until your health care provider approves: ? Travel to high altitudes. ? Fly in airplanes. ? Work in a Estate agent or room. ? Scuba dive.  Do not use any products that contain nicotine or tobacco, such as cigarettes and e-cigarettes. If you need help quitting, ask your health care provider.  Keep your ears dry. Wear fitted earplugs during showering and bathing. Dry your ears completely after. General instructions  Take over-the-counter and prescription medicines only as told by your health care provider.  Use techniques to help pop your ears as recommended by your health care provider. These may include: ? Chewing gum. ? Yawning. ? Frequent, forceful swallowing. ? Closing your mouth, holding your nose closed, and gently blowing as if you are trying to blow air out of your nose.  Keep all follow-up visits as told by your health care provider. This is important. Contact a health care provider if:  Your symptoms do not go away after treatment.  Your symptoms come back after treatment.  You are unable to pop your ears.  You have: ? A fever. ? Pain in your ear. ? Pain in your head or neck. ? Fluid draining from your ear.  Your hearing suddenly changes.  You become very dizzy.  You lose your balance. Summary  Eustachian tube dysfunction refers to a condition in which a blockage develops in the eustachian tube.  It can be caused by ear infections, allergies, inhaled irritants, or abnormal growths in the nose or throat.  Symptoms include ear pain, hearing loss, or ringing in the ears.  Mild cases are treated with maneuvers to unblock the ears, such as yawning or ear popping.  Severe cases are treated with medicines. Surgery may also be  done (rare). This information is not intended to replace advice given to you by your health care provider. Make sure you discuss any questions you have with your health care provider. Document Revised: 08/04/2017 Document Reviewed: 08/04/2017 Elsevier Patient Education  2020 [employer].

## 2020-02-13 NOTE — Progress Notes (Signed)
Subjective:  [name] [name] is a 69 y.o. female who presents for Chief Complaint  Patient presents with  . Ear Pain    bilateral-left is worse       Ear Pain  She reports new onset bilateral ear pain pain, left is worse. was not an injury that may have caused the pain. The pain started about a month ago and is worsening. The pain does radiate to left jaw. The pain is described as achy and sometimes sharp when opening mouth wide, is moderate in intensity, occurring when moving jaw or turning head side to side.   Aggravating factors: opening mouth and turning head left to right  Relieving factors: none.  She has tried OTC ear drops.  with no relief.   ---------------------------------------------------------------------------------------------------  No recent trauma to the face, no recent exposure to loud noise such as gunshot.  She thinks it could be an earwax issue No fever, body aches, chills, change in appetite or lymph node swollen.  No other aggravating or relieving factors.    No other c/o.  The following portions of the patient's history were reviewed and updated as appropriate: allergies, current medications, past family history, past medical history, past social history, past surgical history and problem list.  ROS Otherwise as in subjective above  Objective: BP 136/78   Pulse (!) 49   Ht 5' 2.5" (1.588 m)   Wt 131 lb (59.4 kg)   SpO2 97%   BMI 23.58 kg/m   General appearance: alert, no distress, well developed, well nourished HEENT: normocephalic, sclerae anicteric, conjunctiva pink and moist, left TM with slight fullness in yellowish coloration compared to more per the right TM, otherwise canal and ear unremarkable internally, right TMs pearly, nares patent, no discharge or erythema, pharynx normal Oral cavity: MMM, no lesions, no disrepair or obvious decay She is tender over the left TMJ and somewhat tender along the left mandible at the angle Neck: supple, no  lymphadenopathy, no thyromegaly, no masses    Assessment: Encounter Diagnoses  Name Primary?  . Otalgia of left ear Yes  . Arthralgia of left temporomandibular joint   . Cerumen in auditory canal on examination      Plan: We discussed the possibility of eustachian tube dysfunction versus TMJ syndrome.  There is some color change of the eardrum on the left and fluid suggestive of eustachian tube dysfunction.  You are also tender over the TMJ and jaw.  Discussed other differential that seems less likely.  Recommendations: Begin one of the following options to help decrease pressure in the eustachian tubes  Flonase nasal spray daily for the next week which reduces inflammation and swelling in the sinuses and eustachian tubes  Over-the-counter antihistamine such as Zyrtec or Allegra at night for the next 1 to 2 weeks  Or Sudafed decongestant over-the-counter for the next 5 to 7 days  Drink plenty of water  Swallow often to release pressure in the eustachian tubes  Temporomandibular joint syndrome  Try to talk less and chew gum less for the next week to rest the jaw and TMJ  Consider pool therapy or ice pack to the jaw for 20 minutes at a time  You can use over-the-counter ibuprofen or Aleve twice daily for the next 5 to 7 days as needed  If not much improved within the next week let me know  [name] was seen today for ear pain.  Diagnoses and all orders for this visit:  Otalgia of left ear  Arthralgia  of left temporomandibular joint  Cerumen in auditory canal on examination    Follow up: As needed

## 2020-02-24 ENCOUNTER — Other Ambulatory Visit: Payer: Self-pay | Admitting: Medical

## 2020-02-24 MED ORDER — TIZANIDINE HCL 2 MG PO CAPS: 2.0000 mg | ORAL_CAPSULE | Freq: Every evening | ORAL | 0 refills | Status: DC | PRN

## 2020-02-24 MED ORDER — PREDNISONE 20 MG PO TABS: 40.0000 mg | ORAL_TABLET | Freq: Every day | ORAL | 0 refills | Status: DC

## 2020-02-27 ENCOUNTER — Encounter: Payer: Self-pay | Admitting: Family Medicine

## 2020-02-28 MED ORDER — SCOPOLAMINE 1 MG/3DAYS TD PT72: 1.0000 | MEDICATED_PATCH | TRANSDERMAL | 0 refills | Status: DC

## 2020-03-02 ENCOUNTER — Telehealth: Payer: Self-pay

## 2020-03-02 NOTE — Telephone Encounter (Signed)
P.A. Scopolamine patch, cash price $190, option if this is not approved then with [facility] cheapest [facility] is [facility] for [phone]

## 2020-03-05 ENCOUNTER — Encounter: Payer: Self-pay | Admitting: Family Medicine

## 2020-03-06 NOTE — Telephone Encounter (Signed)
Cash price $190.  P.A. approved, went thru for $10, pt informed

## 2020-03-08 ENCOUNTER — Other Ambulatory Visit: Payer: Self-pay | Admitting: Orthopedic Surgery

## 2020-03-08 DIAGNOSIS — G8929 Other chronic pain: Secondary | ICD-10-CM

## 2020-03-08 DIAGNOSIS — M545 Low back pain, unspecified: Secondary | ICD-10-CM

## 2020-03-13 ENCOUNTER — Other Ambulatory Visit: Payer: Self-pay

## 2020-03-13 ENCOUNTER — Telehealth: Payer: Self-pay | Admitting: Medical

## 2020-03-13 ENCOUNTER — Ambulatory Visit
Admission: RE | Admit: 2020-03-13 | Discharge: 2020-03-13 | Disposition: A | Discharge status: Home / Self Care | Payer: 59 | Source: Ambulatory Visit | Attending: Orthopedic Surgery | Admitting: Orthopedic Surgery

## 2020-03-13 DIAGNOSIS — G8929 Other chronic pain: Secondary | ICD-10-CM

## 2020-03-13 DIAGNOSIS — M545 Low back pain, unspecified: Secondary | ICD-10-CM

## 2020-03-13 MED ORDER — IOPAMIDOL (ISOVUE-M 200) INJECTION 41%
1.0000 mL | Freq: Once | INTRAMUSCULAR | Status: AC
  Administered 2020-03-13: 1 mL via EPIDURAL

## 2020-03-13 MED ORDER — METHYLPREDNISOLONE ACETATE 40 MG/ML INJ SUSP (RADIOLOG
80.0000 mg | Freq: Once | INTRAMUSCULAR | Status: AC
  Administered 2020-03-13: 80 mg via INTRA_ARTICULAR

## 2020-03-13 NOTE — Discharge Instructions (Signed)

## 2020-03-13 NOTE — Telephone Encounter (Signed)
Given the ongoing pain, let us go ahead and get her back in with her PCP Dr. Susann Givens to reexamine, reevaluate  It was not understanding clear what was going on.  She may need a head scan depending on recheck

## 2020-03-13 NOTE — Telephone Encounter (Signed)
Appt made. KH 

## 2020-03-15 ENCOUNTER — Other Ambulatory Visit: Payer: Self-pay

## 2020-03-15 ENCOUNTER — Ambulatory Visit: Payer: Medicare Other | Admitting: Family Medicine

## 2020-03-15 VITALS — BP 140/90 | HR 47 | Temp 97.3°F | Wt 130.0 lb

## 2020-03-15 DIAGNOSIS — M26629 Arthralgia of temporomandibular joint, unspecified side: Secondary | ICD-10-CM | POA: Diagnosis not present

## 2020-03-15 NOTE — Progress Notes (Signed)
   Subjective:    Patient ID: [name] [name], female    DOB: 1950/11/10, 69 y.o.   MRN: [id number]  HPI She is here for consult concerning continued difficulty with left ear pain.  She now feels a popping sensation when she moves her jaw and does have tenderness over the left TM.   Review of Systems     Objective:   Physical Exam Pain on palpation of the left TMJ.  No crepitus is noted with jaw motion.  Both canals and TMs are normal.  Neck is supple without adenopathy.       Assessment & Plan:  TMJ pain dysfunction syndrome I discussed the diagnosis with her and various therapies.  Information was given in the AVS.  Recommend she use 2 Aleve twice a day for the next several weeks.

## 2020-03-15 NOTE — Patient Instructions (Signed)

## 2020-07-03 ENCOUNTER — Telehealth: Payer: Self-pay | Admitting: Family Medicine

## 2020-07-03 NOTE — Telephone Encounter (Signed)
Left message for pt to call concerning Prolia .  

## 2020-07-05 ENCOUNTER — Telehealth: Payer: Self-pay

## 2020-07-05 NOTE — Telephone Encounter (Signed)
For review KH 

## 2020-07-24 ENCOUNTER — Other Ambulatory Visit: Payer: Self-pay

## 2020-07-24 ENCOUNTER — Other Ambulatory Visit: Payer: Medicare Other

## 2020-07-24 DIAGNOSIS — M810 Age-related osteoporosis without current pathological fracture: Secondary | ICD-10-CM

## 2020-07-24 MED ORDER — DENOSUMAB 60 MG/ML ~~LOC~~ SOSY: 60.0000 mg | PREFILLED_SYRINGE | Freq: Once | SUBCUTANEOUS | Status: DC

## 2020-07-24 MED ORDER — DENOSUMAB 60 MG/ML ~~LOC~~ SOSY
60.0000 mg | PREFILLED_SYRINGE | Freq: Once | SUBCUTANEOUS | Status: AC
  Administered 2020-07-24: 60 mg via SUBCUTANEOUS

## 2020-09-17 ENCOUNTER — Telehealth: Payer: Self-pay | Admitting: Family Medicine

## 2020-09-17 NOTE — Telephone Encounter (Signed)
Its not unreasonable to do this but there is nothing cast and stone on that.

## 2020-09-17 NOTE — Telephone Encounter (Signed)
Pt called and wants to know if you think she should switch and get the moderna covid shot states she had the first 3 as phizer, and states she heard it on the news you need to switch for the 4th one

## 2020-09-20 NOTE — Telephone Encounter (Signed)
Called and left message for pt

## 2020-09-26 ENCOUNTER — Ambulatory Visit (INDEPENDENT_AMBULATORY_CARE_PROVIDER_SITE_OTHER): Payer: Medicare Other

## 2020-09-26 ENCOUNTER — Other Ambulatory Visit: Payer: Self-pay

## 2020-09-26 DIAGNOSIS — Z23 Encounter for immunization: Secondary | ICD-10-CM | POA: Diagnosis not present

## 2020-10-31 ENCOUNTER — Ambulatory Visit: Payer: Medicare Other

## 2021-01-25 ENCOUNTER — Other Ambulatory Visit: Payer: Self-pay

## 2021-01-25 ENCOUNTER — Other Ambulatory Visit (INDEPENDENT_AMBULATORY_CARE_PROVIDER_SITE_OTHER): Payer: Medicare Other

## 2021-01-25 DIAGNOSIS — M810 Age-related osteoporosis without current pathological fracture: Secondary | ICD-10-CM

## 2021-01-25 MED ORDER — DENOSUMAB 60 MG/ML ~~LOC~~ SOSY
60.0000 mg | PREFILLED_SYRINGE | Freq: Once | SUBCUTANEOUS | Status: AC
  Administered 2021-01-25: 60 mg via SUBCUTANEOUS

## 2021-04-03 ENCOUNTER — Ambulatory Visit: Payer: Medicare Other | Admitting: Family Medicine

## 2021-04-03 ENCOUNTER — Encounter: Payer: Self-pay | Admitting: Family Medicine

## 2021-04-03 ENCOUNTER — Other Ambulatory Visit: Payer: Self-pay

## 2021-04-03 VITALS — BP 118/80 | HR 50 | Temp 98.2°F | Ht 61.0 in | Wt 128.8 lb

## 2021-04-03 DIAGNOSIS — Z23 Encounter for immunization: Secondary | ICD-10-CM

## 2021-04-03 DIAGNOSIS — M810 Age-related osteoporosis without current pathological fracture: Secondary | ICD-10-CM | POA: Diagnosis not present

## 2021-04-03 DIAGNOSIS — Z1231 Encounter for screening mammogram for malignant neoplasm of breast: Secondary | ICD-10-CM

## 2021-04-03 DIAGNOSIS — E785 Hyperlipidemia, unspecified: Secondary | ICD-10-CM | POA: Diagnosis not present

## 2021-04-03 DIAGNOSIS — Z0000 Encounter for general adult medical examination without abnormal findings: Secondary | ICD-10-CM | POA: Diagnosis not present

## 2021-04-03 DIAGNOSIS — Z8249 Family history of ischemic heart disease and other diseases of the circulatory system: Secondary | ICD-10-CM

## 2021-04-03 DIAGNOSIS — M48061 Spinal stenosis, lumbar region without neurogenic claudication: Secondary | ICD-10-CM

## 2021-04-03 NOTE — Progress Notes (Signed)
[name] [name] is a 70 y.o. female who presents for annual wellness visit,CPE and follow-up on chronic medical conditions.  She continues on her Prolia for treatment of osteoporosis.  She also has a family history of heart disease and hyperlipidemia.  She has seen orthopedics and apparently MRI does show evidence of lumbar stenosis.  She has had at least 1 injection and is now getting physical therapy for that otherwise she seems to be doing quite nicely.  She has no other concerns or complaints.  She and her husband are traveling and enjoying life right now.  Immunizations and Health Maintenance Immunization History  Administered Date(s) Administered   Fluad Quad(high Dose 65+) 01/23/2020   Influenza Split 03/10/2011   Influenza Whole 07/20/2003, 02/16/2008, 02/26/2009   Influenza-Unspecified 02/28/2015, 02/15/2016, 01/19/2019, 01/09/2021   Moderna SARS-COV2 Booster Vaccination 09/26/2020   PFIZER(Purple Top)SARS-COV-2 Vaccination 06/12/2019, 07/05/2019, 01/28/2020   PPD Test 06/23/2016   Pneumococcal Conjugate-13 03/10/2019   Tdap 05/04/2015   Zoster Recombinat (Shingrix) 03/10/2019, 05/09/2019   Zoster, Live 05/04/2015   Health Maintenance Due  Topic Date Due   MAMMOGRAM  04/17/2019   Pneumonia Vaccine 14+ Years old (2 - PPSV23 if available, else PCV20) 03/09/2020   COVID-19 Vaccine (4 - Booster for Pfizer series) 11/21/2020    Last Pap smear: aged out  Last mammogram: 04/16/2017 Last colonoscopy: 07/05/18 Last DEXA:05/30/19 Dentist: Q six months   Ophtho:Q six months Exercise: QD walking and five days a week at the gym  Other doctors caring for patient include: Dr. Dione Booze ophtham.            Dr. Dulce Sellar GI  Advanced directives: Does Patient Have a Medical Advance Directive?: Yes Type of Advance Directive: Living will, Healthcare Power of Attorney Does patient want to make changes to medical advance directive?: No - Patient declined Copy of Healthcare Power of Attorney in Chart?:  Yes - validated most recent copy scanned in chart (See row information)  Depression screen:  See questionnaire below.  Depression screen [facility] 2/9 04/03/2021 11/17/2019 03/21/2016  Decreased Interest 0 0 0  Down, Depressed, Hopeless 0 0 0  PHQ - 2 Score 0 0 0    Fall Risk Screen: see questionnaire below. Fall Risk  04/03/2021 11/17/2019 03/21/2016  Falls in the past year? 0 0 No  Number falls in past yr: 0 - -  Injury with Fall? 0 - -  Risk for fall due to : No Fall Risks - -  Follow up Falls evaluation completed - -    ADL screen:  See questionnaire below Functional Status Survey: Is the patient deaf or have difficulty hearing?: No Does the patient have difficulty seeing, even when wearing glasses/contacts?: No Does the patient have difficulty concentrating, remembering, or making decisions?: No Does the patient have difficulty walking or climbing stairs?: No Does the patient have difficulty dressing or bathing?: No Does the patient have difficulty doing errands alone such as visiting a doctor's office or shopping?: No   Review of Systems Constitutional: -, -unexpected weight change, -anorexia, -fatigue Allergy: -sneezing, -itching, -congestion Dermatology: denies changing moles, rash, lumps ENT: -runny nose, -ear pain, -sore throat,  Cardiology:  -chest pain, -palpitations, -orthopnea, Respiratory: -cough, -shortness of breath, -dyspnea on exertion, -wheezing,  Gastroenterology: -abdominal pain, -nausea, -vomiting, -diarrhea, -constipation, -dysphagia Hematology: -bleeding or bruising problems Musculoskeletal: -arthralgias, -myalgias, -joint swelling, -back pain, - Ophthalmology: -vision changes,  Urology: -dysuria, -difficulty urinating,  -urinary frequency, -urgency, incontinence Neurology: -, -numbness, , -memory loss, -falls, -dizziness  PHYSICAL EXAM: General Appearance: Alert, cooperative, no distress, appears stated age Head: Normocephalic, without obvious  abnormality, atraumatic Eyes: PERRL, conjunctiva/corneas clear, EOM's intact,  Ears: Normal TM's and external ear canals Nose: Nares normal, mucosa normal, no drainage or sinus tenderness Throat: Lips, mucosa, and tongue normal; teeth and gums normal Neck: Supple, no lymphadenopathy;  thyroid:  no enlargement/tenderness/nodules; no carotid bruit or JVD Lungs: Clear to auscultation bilaterally without wheezes, rales or ronchi; respirations unlabored Heart: Regular rate and rhythm, S1 and S2 normal, no murmur, rubor gallop Abdomen: Soft, non-tender, nondistended, normoactive bowel sounds,  no masses, no hepatosplenomegaly Skin:  Skin color, texture, turgor normal, no rashes or lesions Lymph nodes: Cervical, supraclavicular, and axillary nodes normal Neurologic:  CNII-XII intact, normal strength, sensation and gait; reflexes 2+ and symmetric throughout Psych: Normal mood, affect, hygiene and grooming.  ASSESSMENT/PLAN: Routine general medical examination at a health care facility - Plan: CBC with Differential/Platelet, Comprehensive metabolic panel, Lipid panel  Osteoporosis without current pathological fracture, unspecified osteoporosis type  Hyperlipidemia with target LDL less than 100 - Plan: Lipid panel  Family history of heart disease in female family member before age 38 - Plan: Lipid panel  Need for vaccination against Streptococcus pneumoniae - Plan: Pneumococcal polysaccharide vaccine 23-valent greater than or equal to 68yo subcutaneous/IM  Encounter for screening mammogram for malignant neoplasm of breast - Plan: MM Digital Screening  Spinal stenosis of lumbar region, unspecified whether neurogenic claudication present Continue to follow-up with orthopedics concerning her back.  Continue on Prolia.  Encouraged her to continue to take good care of her self.   healthy diet, including goals of calcium and vitamin D intake   Immunization recommendations discussed.  Colonoscopy  recommendations reviewed   Medicare Attestation I have personally reviewed: The patient's medical and social history Their use of alcohol, tobacco or illicit drugs Their current medications and supplements The patient's functional ability including ADLs,fall risks, home safety risks, cognitive, and hearing and visual impairment Diet and physical activities Evidence for depression or mood disorders  The patient's weight, height, and BMI have been recorded in the chart.  I have made referrals, counseling, and provided education to the patient based on review of the above and I have provided the patient with a written personalized care plan for preventive services.     Sharlot Gowda, MD   04/03/2021

## 2021-04-04 LAB — LIPID PANEL
Chol/HDL Ratio: 3.7 ratio (ref 0.0–4.4)
Cholesterol, Total: 264 mg/dL — ABNORMAL HIGH (ref 100–199)
HDL: 72 mg/dL (ref >39)
LDL Chol Calc (NIH): 166 mg/dL — ABNORMAL HIGH (ref 0–99)
Triglycerides: 147 mg/dL (ref 0–149)
VLDL Cholesterol Cal: 26 mg/dL (ref 5–40)

## 2021-04-04 LAB — COMPREHENSIVE METABOLIC PANEL
ALT: 13 IU/L (ref 0–32)
AST: 18 IU/L (ref 0–40)
Albumin/Globulin Ratio: 2.4 — ABNORMAL HIGH (ref 1.2–2.2)
Albumin: 4.8 g/dL (ref 3.8–4.8)
Alkaline Phosphatase: 47 IU/L (ref 44–121)
BUN/Creatinine Ratio: 28 (ref 12–28)
BUN: 19 mg/dL (ref 8–27)
Bilirubin Total: 0.4 mg/dL (ref 0.0–1.2)
CO2: 24 mmol/L (ref 20–29)
Calcium: 9.9 mg/dL (ref 8.7–10.3)
Chloride: 102 mmol/L (ref 96–106)
Creatinine, Ser: 0.69 mg/dL (ref 0.57–1.00)
Globulin, Total: 2 g/dL (ref 1.5–4.5)
Glucose: 92 mg/dL (ref 70–99)
Potassium: 5 mmol/L (ref 3.5–5.2)
Sodium: 139 mmol/L (ref 134–144)
Total Protein: 6.8 g/dL (ref 6.0–8.5)
eGFR: 94 mL/min/{1.73_m2} (ref >59)

## 2021-04-04 LAB — CBC WITH DIFFERENTIAL/PLATELET
Basophils Absolute: 0.1 10*3/uL (ref 0.0–0.2)
Basos: 1 %
EOS (ABSOLUTE): 0.1 10*3/uL (ref 0.0–0.4)
Eos: 2 %
Hematocrit: 40.9 % (ref 34.0–46.6)
Hemoglobin: 13.9 g/dL (ref 11.1–15.9)
Immature Grans (Abs): 0 10*3/uL (ref 0.0–0.1)
Immature Granulocytes: 0 %
Lymphocytes Absolute: 2 10*3/uL (ref 0.7–3.1)
Lymphs: 27 %
MCH: 30 pg (ref 26.6–33.0)
MCHC: 34 g/dL (ref 31.5–35.7)
MCV: 88 fL (ref 79–97)
Monocytes Absolute: 0.6 10*3/uL (ref 0.1–0.9)
Monocytes: 8 %
Neutrophils Absolute: 4.6 10*3/uL (ref 1.4–7.0)
Neutrophils: 62 %
Platelets: 205 10*3/uL (ref 150–450)
RBC: 4.64 x10E6/uL (ref 3.77–5.28)
RDW: 11.7 % (ref 11.7–15.4)
WBC: 7.4 10*3/uL (ref 3.4–10.8)

## 2021-04-17 ENCOUNTER — Other Ambulatory Visit: Payer: Medicare Other

## 2021-05-02 ENCOUNTER — Telehealth: Payer: Self-pay | Admitting: Family Medicine

## 2021-05-02 NOTE — Telephone Encounter (Signed)
Pt's spouse called and states he would like to speak to [place] concerning [place]. He was not very forthcoming in what is needed. Please call John at [phone].

## 2021-05-31 ENCOUNTER — Other Ambulatory Visit: Payer: Self-pay | Admitting: Obstetrics and Gynecology

## 2021-05-31 DIAGNOSIS — Z8249 Family history of ischemic heart disease and other diseases of the circulatory system: Secondary | ICD-10-CM

## 2021-06-03 ENCOUNTER — Other Ambulatory Visit: Payer: Self-pay | Admitting: Obstetrics and Gynecology

## 2021-06-03 DIAGNOSIS — Z803 Family history of malignant neoplasm of breast: Secondary | ICD-10-CM

## 2021-06-18 ENCOUNTER — Telehealth: Payer: Self-pay

## 2021-06-18 NOTE — Telephone Encounter (Signed)
NOTES SCANNED TO REFERRAL 

## 2021-06-27 ENCOUNTER — Ambulatory Visit
Admission: RE | Admit: 2021-06-27 | Discharge: 2021-06-27 | Disposition: A | Discharge status: Home / Self Care | Payer: No Typology Code available for payment source | Source: Ambulatory Visit | Attending: Obstetrics and Gynecology | Admitting: Obstetrics and Gynecology

## 2021-06-27 DIAGNOSIS — Z8249 Family history of ischemic heart disease and other diseases of the circulatory system: Secondary | ICD-10-CM

## 2021-07-03 ENCOUNTER — Other Ambulatory Visit: Payer: Self-pay | Admitting: Obstetrics and Gynecology

## 2021-07-03 DIAGNOSIS — Z803 Family history of malignant neoplasm of breast: Secondary | ICD-10-CM

## 2021-07-04 ENCOUNTER — Telehealth: Payer: Self-pay | Admitting: Family Medicine

## 2021-07-04 NOTE — Telephone Encounter (Signed)
Received a call from pt concerning her next Prolia injection. Pt advised estimated cost is $311. Appt made for 07/26/2021 and medication ordered from pharmacy.  ?

## 2021-07-08 NOTE — Progress Notes (Unsigned)
Referring-John McComb, MD Reason for referral-hyperlipidemia and CAD  HPI: 71 year old female for evaluation of hyperlipidemia and coronary artery disease at request of Arvella Nigh, MD.  Laboratories December 2022 showed total cholesterol 264 with LDL 166.  Calcium score March 2023 274 which was 88th percentile.  Current Outpatient Medications  Medication Sig Dispense Refill   aspirin EC 81 MG tablet Take 81 mg by mouth daily.     Calcium-Vitamin D (CALTRATE 600 PLUS-VIT D PO) Take 1 tablet by mouth daily.      denosumab (PROLIA) 60 MG/ML SOSY injection Inject 60 mg into the skin every 6 (six) months.     dorzolamide-timolol (COSOPT) 22.3-6.8 MG/ML ophthalmic solution INT 1 GTT IN OU BID     latanoprost (XALATAN) 0.005 % ophthalmic solution Place 1 drop into both eyes at bedtime.      Multiple Vitamin (MULTIVITAMIN) capsule Take 1 capsule by mouth daily.     prednisoLONE acetate (PRED FORTE) 1 % ophthalmic suspension SHAKE LQ AND INT 1 GTT IN OS QID AFTER SURGERY (Patient not taking: No sig reported)     tizanidine (ZANAFLEX) 2 MG capsule Take 1 capsule (2 mg total) by mouth at bedtime as needed for muscle spasms. (Patient not taking: Reported on 03/15/2020) 10 capsule 0   tretinoin (RETIN-A) 0.1 % cream APP EXT AA Q NIGHT     zolpidem (AMBIEN) 5 MG tablet take 1 tablet by mouth at bedtime if needed for sleep 15 tablet 1   No current facility-administered medications for this visit.    No Known Allergies   Past Medical History:  Diagnosis Date   Glaucoma    Hyperlipidemia    Osteopenia     No past surgical history on file.  Social History   Socioeconomic History   Marital status: Married    Spouse name: Not on file   Number of children: Not on file   Years of education: Not on file   Highest education level: Not on file  Occupational History   Not on file  Tobacco Use   Smoking status: Never   Smokeless tobacco: Never  Substance and Sexual Activity   Alcohol use:  Yes    Alcohol/week: 0.0 standard drinks    Comment: 2-3/week   Drug use: No   Sexual activity: Not on file  Other Topics Concern   Not on file  Social History Narrative   Not on file   Social Determinants of Health   Financial Resource Strain: Not on file  Food Insecurity: Not on file  Transportation Needs: Not on file  Physical Activity: Not on file  Stress: Not on file  Social Connections: Not on file  Intimate Partner Violence: Not on file    No family history on file.  ROS: no fevers or chills, productive cough, hemoptysis, dysphasia, odynophagia, melena, hematochezia, dysuria, hematuria, rash, seizure activity, orthopnea, PND, pedal edema, claudication. Remaining systems are negative.  Physical Exam:   There were no vitals taken for this visit.  General:  Well developed/well nourished in NAD Skin warm/dry Patient not depressed No peripheral clubbing Back-normal HEENT-normal/normal eyelids Neck supple/normal carotid upstroke bilaterally; no bruits; no JVD; no thyromegaly chest - CTA/ normal expansion CV - RRR/normal S1 and S2; no murmurs, rubs or gallops;  PMI nondisplaced Abdomen -NT/ND, no HSM, no mass, + bowel sounds, no bruit 2+ femoral pulses, no bruits Ext-no edema, chords, 2+ DP Neuro-grossly nonfocal  ECG - personally reviewed  A/P  1 coronary artery disease-based on  elevated calcium score.  We will treat with aspirin 81 mg daily and statin.  2 hyperlipidemia-given documented coronary disease we will treat with Crestor 40 mg daily.  In 8 weeks check lipids and liver.  Kirk Ruths, MD

## 2021-07-15 ENCOUNTER — Encounter: Payer: Self-pay | Admitting: Cardiology

## 2021-07-15 ENCOUNTER — Other Ambulatory Visit: Payer: Self-pay

## 2021-07-15 ENCOUNTER — Ambulatory Visit: Payer: Medicare Other | Admitting: Cardiology

## 2021-07-15 VITALS — BP 122/74 | HR 47 | Ht 62.0 in | Wt 134.6 lb

## 2021-07-15 DIAGNOSIS — Z8249 Family history of ischemic heart disease and other diseases of the circulatory system: Secondary | ICD-10-CM

## 2021-07-15 DIAGNOSIS — I2510 Atherosclerotic heart disease of native coronary artery without angina pectoris: Secondary | ICD-10-CM | POA: Diagnosis not present

## 2021-07-15 DIAGNOSIS — E785 Hyperlipidemia, unspecified: Secondary | ICD-10-CM

## 2021-07-15 MED ORDER — ROSUVASTATIN CALCIUM 40 MG PO TABS: 40.0000 mg | ORAL_TABLET | Freq: Every day | ORAL | 3 refills | Status: DC

## 2021-07-15 NOTE — Patient Instructions (Signed)
Medication Instructions:  ? ?START ROSUVASTATIN 40 MG ONCE DAILY ? ?*If you need a refill on your cardiac medications before your next appointment, please call your pharmacy* ? ? ?Lab Work: ? ?Your physician recommends that you return for lab work in: 8 [facility] ? ?If you have labs (blood work) drawn today and your tests are completely normal, you will receive your results only by: ?MyChart Message (if you have MyChart) OR ?A paper copy in the mail ?If you have any lab test that is abnormal or we need to change your treatment, we will call you to review the results. ? ? ?Follow-Up: ?At [facility], you and your health needs are our priority.  As part of our continuing mission to provide you with exceptional heart care, we have created designated Provider Care Teams.  These Care Teams include your primary Cardiologist (physician) and Advanced Practice Providers (APPs -  Physician Assistants and Nurse Practitioners) who all work together to provide you with the care you need, when you need it. ? ?We recommend signing up for the patient portal called "MyChart".  Sign up information is provided on this After Visit Summary.  MyChart is used to connect with patients for Virtual Visits (Telemedicine).  Patients are able to view lab/test results, encounter notes, upcoming appointments, etc.  Non-urgent messages can be sent to your provider as well.   ?To learn more about what you can do with MyChart, go to [email or web address].   ? ?Your next appointment:   ?12 week(s) ? ?The format for your next appointment:   ?In Person ? ?Provider:   ?Olga Millers MD  ? ? ?

## 2021-07-23 ENCOUNTER — Ambulatory Visit
Admission: RE | Admit: 2021-07-23 | Discharge: 2021-07-23 | Disposition: A | Discharge status: Home / Self Care | Payer: Medicare Other | Source: Ambulatory Visit | Attending: Obstetrics and Gynecology | Admitting: Obstetrics and Gynecology

## 2021-07-23 DIAGNOSIS — Z803 Family history of malignant neoplasm of breast: Secondary | ICD-10-CM

## 2021-07-23 MED ORDER — GADOBUTROL 1 MMOL/ML IV SOLN
6.0000 mL | Freq: Once | INTRAVENOUS | Status: AC | PRN
  Administered 2021-07-23: 6 mL via INTRAVENOUS

## 2021-07-26 ENCOUNTER — Other Ambulatory Visit: Payer: Medicare Other

## 2021-07-26 DIAGNOSIS — M810 Age-related osteoporosis without current pathological fracture: Secondary | ICD-10-CM

## 2021-07-26 MED ORDER — DENOSUMAB 60 MG/ML ~~LOC~~ SOSY
60.0000 mg | PREFILLED_SYRINGE | Freq: Once | SUBCUTANEOUS | Status: AC
  Administered 2021-07-26: 60 mg via SUBCUTANEOUS

## 2021-09-24 ENCOUNTER — Encounter: Payer: Self-pay | Admitting: *Deleted

## 2021-10-03 LAB — HEPATIC FUNCTION PANEL
ALT: 19 IU/L (ref 0–32)
AST: 20 IU/L (ref 0–40)
Albumin: 4.8 g/dL (ref 3.8–4.8)
Alkaline Phosphatase: 51 IU/L (ref 44–121)
Bilirubin Total: 0.7 mg/dL (ref 0.0–1.2)
Bilirubin, Direct: 0.2 mg/dL (ref 0.00–0.40)
Total Protein: 6.6 g/dL (ref 6.0–8.5)

## 2021-10-03 LAB — LIPID PANEL
Chol/HDL Ratio: 2.3 ratio (ref 0.0–4.4)
Cholesterol, Total: 164 mg/dL (ref 100–199)
HDL: 71 mg/dL (ref >39)
LDL Chol Calc (NIH): 77 mg/dL (ref 0–99)
Triglycerides: 86 mg/dL (ref 0–149)
VLDL Cholesterol Cal: 16 mg/dL (ref 5–40)

## 2021-10-11 ENCOUNTER — Other Ambulatory Visit: Payer: Self-pay | Admitting: *Deleted

## 2021-10-11 DIAGNOSIS — E785 Hyperlipidemia, unspecified: Secondary | ICD-10-CM

## 2021-10-11 MED ORDER — EZETIMIBE 10 MG PO TABS: 10.0000 mg | ORAL_TABLET | Freq: Every day | ORAL | 3 refills | Status: DC

## 2021-11-29 LAB — HEPATIC FUNCTION PANEL
ALT: 32 IU/L (ref 0–32)
AST: 36 IU/L (ref 0–40)
Albumin: 4.7 g/dL (ref 3.9–4.9)
Alkaline Phosphatase: 52 IU/L (ref 44–121)
Bilirubin Total: 0.7 mg/dL (ref 0.0–1.2)
Bilirubin, Direct: 0.2 mg/dL (ref 0.00–0.40)
Total Protein: 6.6 g/dL (ref 6.0–8.5)

## 2021-11-29 LAB — LIPID PANEL
Chol/HDL Ratio: 2 ratio (ref 0.0–4.4)
Cholesterol, Total: 127 mg/dL (ref 100–199)
HDL: 63 mg/dL (ref >39)
LDL Chol Calc (NIH): 49 mg/dL (ref 0–99)
Triglycerides: 72 mg/dL (ref 0–149)
VLDL Cholesterol Cal: 15 mg/dL (ref 5–40)

## 2022-01-01 ENCOUNTER — Encounter: Payer: Self-pay | Admitting: Internal Medicine

## 2022-01-14 ENCOUNTER — Telehealth: Payer: Self-pay

## 2022-01-14 ENCOUNTER — Telehealth: Payer: Self-pay | Admitting: Cardiology

## 2022-01-14 ENCOUNTER — Ambulatory Visit: Payer: Medicare Other | Admitting: Family Medicine

## 2022-01-14 ENCOUNTER — Telehealth: Payer: Self-pay | Admitting: Family Medicine

## 2022-01-14 VITALS — BP 170/90 | HR 45 | Temp 97.3°F | Wt 133.0 lb

## 2022-01-14 DIAGNOSIS — R001 Bradycardia, unspecified: Secondary | ICD-10-CM

## 2022-01-14 DIAGNOSIS — I2510 Atherosclerotic heart disease of native coronary artery without angina pectoris: Secondary | ICD-10-CM

## 2022-01-14 HISTORY — DX: Atherosclerotic heart disease of native coronary artery without angina pectoris: I25.10

## 2022-01-14 NOTE — Telephone Encounter (Signed)
A NP from [facility] called to let us know that she was having bradycardia heart rate was between 30-40. She is not having any symptoms. She wanted her to have an EKG here today if at all possible. They have her set up to see Cardiology tomorrow at 10:55 PA Richardson Dopp but the [facility] NP wanted her to have an EKG first before her apt tomorrow. She also did a PAD test and the pt. Scored low on both legs. Rt. Was 0.22 lt. Was 0.24

## 2022-01-14 NOTE — Progress Notes (Unsigned)
Cardiology Office Note:    Date:  01/15/2022   ID:  [name] [name], [name] [name] Apr 03, 1951, MRN [id number]  PCP:  Denita Lung, MD  [facility] Cardiologist:  Kirk Ruths, MD     Referring MD: Denita Lung, MD   Chief Complaint:  Bradycardia    Patient Profile: Coronary artery disease  CAC Score 3/23: 46 (88th percentile)  Hyperlipidemia   Prior CV Studies: CAC SCORE 06/27/21 IMPRESSION: Coronary calcium score 274 is at the [place] percentile for the patient's age, sex and race.  History of Present Illness:   [name] [name] is a 70 y.o. female with the above problem list.  She established with Dr. Stanford Breed in March 23. She returns for the evaluation of bradycardia. She had an evaluation with her insurance company at home yesterday. She was told that her HR was very low and this appt was arranged. She also saw her PCP. An EKG was done but is not in scanned in the chart yet. She is here alone today. She is very active. She used to run and had to stop when she fractured her patella. She now exercises daily at the [facility]. She uses a recumbent bike among other things. She has no limitations. She has not had chest pain, shortness of breath, syncope, near syncope, exercise intolerance.         Past Medical History:  Diagnosis Date   Coronary artery disease involving native coronary artery of native heart without angina pectoris 01/14/2022   CAC SCORE 06/27/21  IMPRESSION:  Coronary calcium score 274 is at the [place] percentile for the  patient's age, sex and race.   Glaucoma    Hyperlipidemia    Osteopenia    Current Medications: Current Meds  Medication Sig   Calcium-Vitamin D (CALTRATE 600 PLUS-VIT D PO) Take 1 tablet by mouth daily.    denosumab (PROLIA) 60 MG/ML SOSY injection Inject 60 mg into the skin every 6 (six) months.   dorzolamide-timolol (COSOPT) 22.3-6.8 MG/ML ophthalmic solution INT 1 GTT IN OU BID   ezetimibe (ZETIA) 10 MG tablet Take 1 tablet (10 mg total)  by mouth daily.   Multiple Vitamin (MULTIVITAMIN) capsule Take 1 capsule by mouth daily.   ROCKLATAN 0.02-0.005 % SOLN Apply 1 drop to eye at bedtime.   rosuvastatin (CRESTOR) 40 MG tablet Take 1 tablet (40 mg total) by mouth daily.   tretinoin (RETIN-A) 0.1 % cream APP EXT AA Q NIGHT   zolpidem (AMBIEN) 5 MG tablet take 1 tablet by mouth at bedtime if needed for sleep    Allergies:   Patient has no known allergies.   Social History   Tobacco Use   Smoking status: Never   Smokeless tobacco: Never  Substance Use Topics   Alcohol use: Yes    Alcohol/week: 0.0 standard drinks of alcohol    Comment: 2-3/week   Drug use: No    Family Hx: The patient's family history includes Heart attack in her father and mother.  ROS - See HPI  EKGs/Labs/Other Test Reviewed:    EKG:  EKG is   ordered today.  The ekg ordered today demonstrates sinus brady, HR 48, normal axis, no ST-TW changes, QTc 423 ms   Recent Labs: 04/03/2021: BUN 19; Creatinine, Ser 0.69; Hemoglobin 13.9; Platelets 205; Potassium 5.0; Sodium 139 11/29/2021: ALT 32   Recent Lipid Panel Recent Labs    11/29/21 0821  CHOL 127  TRIG 72  HDL 63  LDLCALC 49  Risk Assessment/Calculations/Metrics:         HYPERTENSION CONTROL Vitals:   01/15/22 1048 01/15/22 1201  BP: (!) 140/80 (!) 160/86    The patient's blood pressure is elevated above target today.  In order to address the patient's elevated BP: Blood pressure will be monitored at home to determine if medication changes need to be made.       Physical Exam:    VS:  BP (!) 160/86   Pulse (!) 48   Ht 5\' 2"  (1.575 m)   Wt 132 lb 9.6 oz (60.1 kg)   SpO2 98%   BMI 24.25 kg/m     Wt Readings from Last 3 Encounters:  01/15/22 132 lb 9.6 oz (60.1 kg)  01/14/22 133 lb (60.3 kg)  07/15/21 134 lb 9.6 oz (61.1 kg)    Physical Exam       ASSESSMENT & PLAN:   Sinus bradycardia Her EKG shows sinus bradycardia. Her HR was similar when she saw Dr. 07/17/21 in  March 2023. I reviewed her chart and she has had HRs in the 40s-50s for years. She is very active and exercises on a regular basis. She used to be a runner. I suspect her low basal HR is in response to her high level of activity. She has not had syncope, near syncope, profound fatigue or exercise intolerance. She does not have any evidence of AV block on her EKG. I do not think there is any reason to pursue wearing a monitor at this time. We discussed symptoms that would prompt earlier f/u. I did suggest that she could look into a April 2023 device. If she decides to purchase this, she can use it to check her HR and rhythm as needed at home.   Elevated blood pressure reading in office without diagnosis of hypertension BP is elevated today. It was elevated yesterday at her PCP's office. It has previously been controlled.  I have asked her to purchase a BP cuff and monitor it over the next 2 weeks. She can send the readings to me or Dr. [employer] for review. We did discuss the ACC/AHA guidelines for monitoring BP at home.   Hyperlipidemia with target LDL less than 100 LDL optimal on most recent lab work.  Continue current Rx.  She does note an unusual skin sensation and wonders if it is from the Zetia or Crestor. I have recommended that she hold each one for 2-3 weeks. If she clearly has resolution of her symptoms, she should notify Susann Givens.   Coronary artery disease involving native coronary artery of native heart without angina pectoris Elevated CAC score (88th percentile) on CT in 2023. She is not having angina. Continue Crestor 40 mg once daily, Zetia 10 mg once daily. She stopped the ASA b/c she was not sure she should take it. We discussed the benefits of ASA Rx in patients with CAC score >/= 75 th percentile. She will resume ASA 81 mg once daily.             Dispo:  Return in about 6 months (around 07/16/2022) for Routine Follow Up w Dr. 07/18/2022 .   Medication Adjustments/Labs and Tests  Ordered: Current medicines are reviewed at length with the patient today.  Concerns regarding medicines are outlined above.  Tests Ordered: Orders Placed This Encounter  Procedures   EKG 12-Lead   Medication Changes: No orders of the defined types were placed in this encounter.  Signed, Jens Som, PA-C  01/15/2022 12:18 PM  [facility] [address], [place], [place]  [address] Phone: [phone]; Fax: [phone]

## 2022-01-14 NOTE — Telephone Encounter (Signed)
I leave at 12:30 today so please send response to someone else if you get to after that\.

## 2022-01-14 NOTE — Telephone Encounter (Signed)
Left message to call back  

## 2022-01-14 NOTE — Telephone Encounter (Signed)
STAT if HR is under 50 or over 120 (normal HR is 60-100 beats per minute)  What is your heart rate?   Do you have a log of your heart rate readings (document readings)? Around 30-40    Do you have any other symptoms? Severe bradycardia today. Pt nurse called from pt's phone she said that pt's heart has been around 30-40 today. She states she believes the pt needs an EKG. Pt made an appt with APP Kathlen Mody tomorrow at 10:55

## 2022-01-14 NOTE — Progress Notes (Signed)
   Subjective:    Patient ID: [name] [name], female    DOB: Mar 19, 1951, 71 y.o.   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  During the exam apparently her heart rate was noted at 40 but she was having no weakness, chest pain, shortness of breath, syncopal episodes.  BNP then on her own set up an appointment for her to be seen by cardiology and did not contact me concerning this.  The patient contacted me about this.  She used to be a runner but has not done so in quite some time.  She was quite concerned over the heart rate issue.   Review of Systems     Objective:   Physical Exam Alert and in no distress.  Cardiac exam shows a rate of 67.  Normal S1-S2 without murmurs or gallops.  Lungs are clear to auscultation.  EKG read by me did show a rate of 43 with other parameters being normal.       Assessment & Plan:  Bradycardia She has already been set up to see cardiology which I think is reasonable.  We then talked about the fact that the nurse practitioner did not bother to call me concerning this to get my input and help with it.

## 2022-01-14 NOTE — Telephone Encounter (Signed)
Pt called concerning next Prolia injection. PT advised out of pocket cost estimated at $326.00 and appt needs to be after 12/27/2021. Pt scheduled for 01/29/2022. Pt also advised to call when leaving house so medication can be set out.

## 2022-01-15 ENCOUNTER — Other Ambulatory Visit: Payer: Self-pay

## 2022-01-15 ENCOUNTER — Encounter: Payer: Self-pay | Admitting: Physician Assistant

## 2022-01-15 ENCOUNTER — Ambulatory Visit: Payer: Medicare Other | Attending: Physician Assistant | Admitting: Physician Assistant

## 2022-01-15 VITALS — BP 160/86 | HR 48 | Ht 62.0 in | Wt 132.6 lb

## 2022-01-15 DIAGNOSIS — E785 Hyperlipidemia, unspecified: Secondary | ICD-10-CM

## 2022-01-15 DIAGNOSIS — R001 Bradycardia, unspecified: Secondary | ICD-10-CM | POA: Diagnosis not present

## 2022-01-15 DIAGNOSIS — I2510 Atherosclerotic heart disease of native coronary artery without angina pectoris: Secondary | ICD-10-CM | POA: Diagnosis not present

## 2022-01-15 DIAGNOSIS — R0989 Other specified symptoms and signs involving the circulatory and respiratory systems: Secondary | ICD-10-CM

## 2022-01-15 DIAGNOSIS — R030 Elevated blood-pressure reading, without diagnosis of hypertension: Secondary | ICD-10-CM | POA: Insufficient documentation

## 2022-01-15 NOTE — Assessment & Plan Note (Signed)
Her EKG shows sinus bradycardia. Her HR was similar when she saw Dr. Stanford Breed in March 2023. I reviewed her chart and she has had HRs in the 40s-50s for years. She is very active and exercises on a regular basis. She used to be a runner. I suspect her low basal HR is in response to her high level of activity. She has not had syncope, near syncope, profound fatigue or exercise intolerance. She does not have any evidence of AV block on her EKG. I do not think there is any reason to pursue wearing a monitor at this time. We discussed symptoms that would prompt earlier f/u. I did suggest that she could look into a [employer] device. If she decides to purchase this, she can use it to check her HR and rhythm as needed at home.

## 2022-01-15 NOTE — Patient Instructions (Addendum)
Medication Instructions:  Your physician recommends that you continue on your current medications as directed. Please refer to the Current Medication list given to you today.  *If you need a refill on your cardiac medications before your next appointment, please call your pharmacy*   Lab Work: None ordered  If you have labs (blood work) drawn today and your tests are completely normal, you will receive your results only by: [place] (if you have MyChart) OR A paper copy in the mail If you have any lab test that is abnormal or we need to change your treatment, we will call you to review the results.   Testing/Procedures: None ordered   Follow-Up: At [facility], you and your health needs are our priority.  As part of our continuing mission to provide you with exceptional heart care, we have created designated Provider Care Teams.  These Care Teams include your primary Cardiologist (physician) and Advanced Practice Providers (APPs -  Physician Assistants and Nurse Practitioners) who all work together to provide you with the care you need, when you need it.  We recommend signing up for the patient portal called "MyChart".  Sign up information is provided on this After Visit Summary.  MyChart is used to connect with patients for Virtual Visits (Telemedicine).  Patients are able to view lab/test results, encounter notes, upcoming appointments, etc.  Non-urgent messages can be sent to your provider as well.   To learn more about what you can do with MyChart, go to [email or web address].    Your next appointment:   6 month(s)  The format for your next appointment:   In Person  Provider:     Dr. Stanford Breed  Other Instructions Your physician has requested that you regularly monitor and record your blood pressure readings at home. Please use the same machine at the same time of day to check your readings and record them to bring to your follow-up visit.   Please monitor  blood pressures and keep a log of your readings for 2 weeks then send me a mychart message with them.    Make sure to check 2 hours after your medications.    AVOID these things for 30 minutes before checking your blood pressure: No Drinking caffeine. No Drinking alcohol. No Eating. No Smoking. No Exercising.   Five minutes before checking your blood pressure: Pee. Sit in a dining chair. Avoid sitting in a soft couch or armchair. Be quiet. Do not talk   You can look into the [place] by [employer]. This device is purchased by you and it connects to an application you download to your smart phone.  It can detect abnormal heart rhythms and alert you to contact your doctor for further evaluation. The web site is:  https://www.alivecor.com    Important Information About Sugar

## 2022-01-15 NOTE — Assessment & Plan Note (Addendum)
BP is elevated today. It was elevated yesterday at her PCP's office. It has previously been controlled.  I have asked her to purchase a BP cuff and monitor it over the next 2 weeks. She can send the readings to me or Dr. Redmond School for review. We did discuss the ACC/AHA guidelines for monitoring BP at home.

## 2022-01-15 NOTE — Assessment & Plan Note (Signed)
Elevated CAC score (88th percentile) on CT in 2023. She is not having angina. Continue Crestor 40 mg once daily, Zetia 10 mg once daily. She stopped the ASA b/c she was not sure she should take it. We discussed the benefits of ASA Rx in patients with CAC score >/= 75 th percentile. She will resume ASA 81 mg once daily.

## 2022-01-15 NOTE — Assessment & Plan Note (Signed)
LDL optimal on most recent lab work.  Continue current Rx.  She does note an unusual skin sensation and wonders if it is from the Zetia or Crestor. I have recommended that she hold each one for 2-3 weeks. If she clearly has resolution of her symptoms, she should notify us.

## 2022-01-16 ENCOUNTER — Encounter: Payer: Self-pay | Admitting: Family Medicine

## 2022-01-16 NOTE — Telephone Encounter (Signed)
Patient has been seen.

## 2022-01-28 ENCOUNTER — Telehealth: Payer: Self-pay | Admitting: Family Medicine

## 2022-01-28 NOTE — Telephone Encounter (Signed)
Vascular US

## 2022-01-29 ENCOUNTER — Ambulatory Visit (HOSPITAL_COMMUNITY)
Admission: RE | Admit: 2022-01-29 | Discharge: 2022-01-29 | Disposition: A | Discharge status: Home / Self Care | Payer: Medicare Other | Source: Ambulatory Visit | Attending: Family Medicine | Admitting: Family Medicine

## 2022-01-29 ENCOUNTER — Other Ambulatory Visit: Payer: Medicare Other

## 2022-01-29 DIAGNOSIS — M810 Age-related osteoporosis without current pathological fracture: Secondary | ICD-10-CM

## 2022-01-29 DIAGNOSIS — R0989 Other specified symptoms and signs involving the circulatory and respiratory systems: Secondary | ICD-10-CM | POA: Insufficient documentation

## 2022-01-29 DIAGNOSIS — Z23 Encounter for immunization: Secondary | ICD-10-CM | POA: Diagnosis not present

## 2022-01-29 MED ORDER — DENOSUMAB 60 MG/ML ~~LOC~~ SOSY
60.0000 mg | PREFILLED_SYRINGE | Freq: Once | SUBCUTANEOUS | Status: AC
  Administered 2022-01-29: 60 mg via SUBCUTANEOUS

## 2022-01-29 NOTE — Progress Notes (Signed)
ABI's have been completed. Preliminary results can be found in CV Proc through chart review.   01/29/22 10:19 AM [name] [name] RVT

## 2022-01-30 ENCOUNTER — Other Ambulatory Visit: Payer: Self-pay

## 2022-01-30 DIAGNOSIS — R0989 Other specified symptoms and signs involving the circulatory and respiratory systems: Secondary | ICD-10-CM

## 2022-02-04 ENCOUNTER — Encounter: Payer: Self-pay | Admitting: Internal Medicine

## 2022-02-06 NOTE — Progress Notes (Signed)
Office Note     CC:  Abnormal ABIs Requesting Provider:  Ronnald Nian, MD  HPI: [name] [name] is a 71 y.o. (1950/09/04) female presenting at the request of .Ronnald Nian, MD after an abnormal home study with [place] [facility].  On exam today, [name] [name] was doing well.  A native of [place], she moved with her family to several locations prior to settling with her husband in [place].  She has 2 children, both of which live on the [place]. [name] [name] denies symptoms of claudication, ischemic rest pain, tissue loss.  She is a non-smoker.  No chronic kidney disease, no diabetes.  She retired from working for the city of [place] continues to live an active lifestyle with her husband.   Past Medical History:  Diagnosis Date   Coronary artery disease involving native coronary artery of native heart without angina pectoris 01/14/2022   CAC SCORE 06/27/21  IMPRESSION:  Coronary calcium score 274 is at the 88th percentile for the  patient's age, sex and race.   Glaucoma    Hyperlipidemia    Osteopenia     Past Surgical History:  Procedure Laterality Date   Cornea surgery     PATELLA FRACTURE SURGERY      Social History   Socioeconomic History   Marital status: Married    Spouse name: Not on file   Number of children: 3   Years of education: Not on file   Highest education level: Not on file  Occupational History   Not on file  Tobacco Use   Smoking status: Never   Smokeless tobacco: Never  Substance and Sexual Activity   Alcohol use: Yes    Alcohol/week: 0.0 standard drinks of alcohol    Comment: 2-3/week   Drug use: No   Sexual activity: Not on file  Other Topics Concern   Not on file  Social History Narrative   Not on file   Social Determinants of Health   Financial Resource Strain: Not on file  Food Insecurity: Not on file  Transportation Needs: Not on file  Physical Activity: Not on file  Stress: Not on file  Social Connections: Not on file  Intimate Partner  Violence: Not on file   Family History  Problem Relation Age of Onset   Heart attack Mother    Heart attack Father     Current Outpatient Medications  Medication Sig Dispense Refill   Calcium-Vitamin D (CALTRATE 600 PLUS-VIT D PO) Take 1 tablet by mouth daily.      denosumab (PROLIA) 60 MG/ML SOSY injection Inject 60 mg into the skin every 6 (six) months.     dorzolamide-timolol (COSOPT) 22.3-6.8 MG/ML ophthalmic solution INT 1 GTT IN OU BID     ezetimibe (ZETIA) 10 MG tablet Take 1 tablet (10 mg total) by mouth daily. 90 tablet 3   Multiple Vitamin (MULTIVITAMIN) capsule Take 1 capsule by mouth daily.     ROCKLATAN 0.02-0.005 % SOLN Apply 1 drop to eye at bedtime.     rosuvastatin (CRESTOR) 40 MG tablet Take 1 tablet (40 mg total) by mouth daily. 90 tablet 3   tretinoin (RETIN-A) 0.1 % cream APP EXT AA Q NIGHT     zolpidem (AMBIEN) 5 MG tablet take 1 tablet by mouth at bedtime if needed for sleep 15 tablet 1   No current facility-administered medications for this visit.    No Known Allergies   REVIEW OF SYSTEMS:   [X]  denotes positive finding, [ ]  denotes  negative finding Cardiac  Comments:  Chest pain or chest pressure:    Shortness of breath upon exertion:    Short of breath when lying flat:    Irregular heart rhythm:        Vascular    Pain in calf, thigh, or hip brought on by ambulation:    Pain in feet at night that wakes you up from your sleep:     Blood clot in your veins:    Leg swelling:         Pulmonary    Oxygen at home:    Productive cough:     Wheezing:         Neurologic    Sudden weakness in arms or legs:     Sudden numbness in arms or legs:     Sudden onset of difficulty speaking or slurred speech:    Temporary loss of vision in one eye:     Problems with dizziness:         Gastrointestinal    Blood in stool:     Vomited blood:         Genitourinary    Burning when urinating:     Blood in urine:        Psychiatric    Major depression:          Hematologic    Bleeding problems:    Problems with blood clotting too easily:        Skin    Rashes or ulcers:        Constitutional    Fever or chills:      PHYSICAL EXAMINATION:  There were no vitals filed for this visit.  General:  WDWN in NAD; vital signs documented above Gait: Not observed HENT: WNL, normocephalic Pulmonary: normal non-labored breathing , without wheezing Cardiac: regular HR,  Abdomen: soft, NT, no masses Skin: without rashes Vascular Exam/Pulses:  Right Left  Radial 2+ (normal) 2+ (normal)  Ulnar    Femoral    Popliteal    DP 2+ (normal) 2+ (normal)  PT 2+ (normal) 2+ (normal)   Extremities: without ischemic changes, without Gangrene , without cellulitis; without open wounds;  Musculoskeletal: no muscle wasting or atrophy  Neurologic: A&O X 3;  No focal weakness or paresthesias are detected Psychiatric:  The pt has Normal affect.   Non-Invasive Vascular Imaging:   ABI Findings:  +---------+------------------+-----+-----------+--------+  Right    Rt Pressure (mmHg)IndexWaveform   Comment   +---------+------------------+-----+-----------+--------+  Brachial 161                    triphasic            +---------+------------------+-----+-----------+--------+  PTA      155               0.96 multiphasic          +---------+------------------+-----+-----------+--------+  DP       143               0.89 multiphasic          +---------+------------------+-----+-----------+--------+  Great Toe85                0.53                      +---------+------------------+-----+-----------+--------+   +---------+------------------+-----+-----------+-------+  Left     Lt Pressure (mmHg)IndexWaveform   Comment  +---------+------------------+-----+-----------+-------+  Brachial 143  triphasic           +---------+------------------+-----+-----------+-------+  PTA      142                0.88 multiphasic         +---------+------------------+-----+-----------+-------+  DP       143               0.89 multiphasic         +---------+------------------+-----+-----------+-------+  Great Toe92                0.57                     +---------+------------------+-----+-----------+-------+   ASSESSMENT/PLAN: [name] [name] is a 71 y.o. female presenting with normal ABIs bilaterally.  Testing occurred due to a false positive home test from [place] [facility].  [name] [name] I discussed that she does not have any risk factors associated with peripheral arterial disease.  She would be best served with continued early exercise.  Should she have new onset claudication, ischemic rest pain, tissue loss I would be more than happy to see her back in the office.  At this time she can follow-up as needed.    Victorino Sparrow, MD [facility] [phone]

## 2022-02-07 ENCOUNTER — Ambulatory Visit: Payer: Medicare Other | Admitting: Vascular Surgery

## 2022-02-07 ENCOUNTER — Encounter: Payer: Self-pay | Admitting: Vascular Surgery

## 2022-02-07 VITALS — BP 159/74 | HR 51 | Temp 97.8°F | Resp 20 | Ht 62.0 in | Wt 132.0 lb

## 2022-02-07 DIAGNOSIS — R9430 Abnormal result of cardiovascular function study, unspecified: Secondary | ICD-10-CM

## 2022-02-12 ENCOUNTER — Telehealth: Payer: Self-pay | Admitting: Family Medicine

## 2022-02-12 NOTE — Telephone Encounter (Signed)
Pt called and states that she received a call from someone here that wanted to know who she saw at [facility], She has finally found that info. According to pt she saw a [place].

## 2022-02-17 ENCOUNTER — Encounter: Payer: Self-pay | Admitting: Internal Medicine

## 2022-03-05 ENCOUNTER — Ambulatory Visit: Payer: Medicare Other | Admitting: Family Medicine

## 2022-03-05 ENCOUNTER — Encounter: Payer: Self-pay | Admitting: Family Medicine

## 2022-03-05 VITALS — BP 138/86 | HR 72 | Ht 62.0 in | Wt 131.6 lb

## 2022-03-05 DIAGNOSIS — R001 Bradycardia, unspecified: Secondary | ICD-10-CM

## 2022-03-05 DIAGNOSIS — R0989 Other specified symptoms and signs involving the circulatory and respiratory systems: Secondary | ICD-10-CM | POA: Diagnosis not present

## 2022-03-05 NOTE — Progress Notes (Signed)
   Subjective:    Patient ID: [name] [name], female    DOB: 04-20-1951, 71 y.o.   MRN: [id number]  HPI She is here for recheck.  She was recently seen and evaluated for an abnormal ABI and also for bradycardia.  This was done after she was evaluated at her home by a nurse sent in by the insurance company.  The nurse actually sent her to cardiology without checking with me.   Review of Systems     Objective:   Physical Exam Alert and in no distress otherwise not examined The notes from both encounters were evaluated.  Essentially no abnormalities were noted and no further intervention is needed.      Assessment & Plan:  Abnormal pulse  Bradycardia I reviewed information with her and explained that no abnormalities were found and therefore no intervention was needed

## 2022-04-10 ENCOUNTER — Encounter: Payer: Self-pay | Admitting: Family Medicine

## 2022-04-10 ENCOUNTER — Ambulatory Visit: Payer: Medicare Other | Admitting: Family Medicine

## 2022-04-10 VITALS — BP 110/72 | HR 50 | Ht 60.75 in | Wt 136.6 lb

## 2022-04-10 DIAGNOSIS — I2510 Atherosclerotic heart disease of native coronary artery without angina pectoris: Secondary | ICD-10-CM | POA: Diagnosis not present

## 2022-04-10 DIAGNOSIS — Z0000 Encounter for general adult medical examination without abnormal findings: Secondary | ICD-10-CM

## 2022-04-10 DIAGNOSIS — M810 Age-related osteoporosis without current pathological fracture: Secondary | ICD-10-CM

## 2022-04-10 DIAGNOSIS — E785 Hyperlipidemia, unspecified: Secondary | ICD-10-CM | POA: Diagnosis not present

## 2022-04-10 DIAGNOSIS — Z8249 Family history of ischemic heart disease and other diseases of the circulatory system: Secondary | ICD-10-CM | POA: Diagnosis not present

## 2022-04-10 NOTE — Progress Notes (Signed)
[name] [name] is a 71 y.o. female who presents for annual wellness visit and follow-up on chronic medical conditions.  She has no particular concerns or questions.  She does have history of coronary artery disease and has seen cardiology in the past.  Presently she is taking Crestor 40 mg and Zetia 10 mg on a regular basis.  Recent blood work did show adequate response to that.  She is also taking a multivitamin as well as extra vitamin D and Prolia.  She keeps herself quite physically active.  Immunizations and Health Maintenance  Health Maintenance Due  Topic Date Due   COVID-19 Vaccine (6 - 2023-24 season) 12/27/2021    Last Pap smear:2023 Last mammogram: 07/24/2021 Last colonoscopy: 07/05/2018 Last DEXA: 05/30/2019 Dentist: 2023 Ophtho: 2023 Exercise: walking 3 miles daily  Other doctors caring for patient include:  Crenshaw, Madolyn Frieze, MD Cardiology  Ernesto Rutherford , MD                      Ophthalmology  Advanced directives:yes need copy    Depression screen:  See questionnaire below.     04/10/2022    1:47 PM 04/03/2021    1:48 PM 11/17/2019    9:47 AM 03/21/2016    9:17 AM  Depression screen PHQ 2/9  Decreased Interest 0 0 0 0  Down, Depressed, Hopeless 0 0 0 0  PHQ - 2 Score 0 0 0 0    Fall Risk Screen: see questionnaire below.    04/10/2022    1:47 PM 04/03/2021    1:47 PM 11/17/2019    9:47 AM 03/21/2016    9:17 AM  Fall Risk   Falls in the past year? 0 0 0 No  Number falls in past yr: 0 0    Injury with Fall? 0 0    Risk for fall due to : No Fall Risks No Fall Risks    Follow up Falls evaluation completed Falls evaluation completed      ADL screen:  See questionnaire below Functional Status Survey:     Review of Systems Constitutional: -, -unexpected weight change, -anorexia, -fatigue Allergy: -sneezing, -itching, -congestion Dermatology: denies changing moles, rash, lumps ENT: -runny nose, -ear pain, -sore throat,  Cardiology:  -chest pain,  -palpitations, -orthopnea, Respiratory: -cough, -shortness of breath, -dyspnea on exertion, -wheezing,  Gastroenterology: -abdominal pain, -nausea, -vomiting, -diarrhea, -constipation, -dysphagia Hematology: -bleeding or bruising problems Musculoskeletal: -arthralgias, -myalgias, -joint swelling, -back pain, - Ophthalmology: -vision changes,  Urology: -dysuria, -difficulty urinating,  -urinary frequency, -urgency, incontinence Neurology: -, -numbness, , -memory loss, -falls, -dizziness    PHYSICAL EXAM:  BP 110/72   Pulse (!) 50   Ht 5' 0.75" (1.543 m)   Wt 136 lb 9.6 oz (62 kg)   SpO2 99% Comment: room air  BMI 26.02 kg/m   General Appearance: Alert, cooperative, no distress, appears stated age Head: Normocephalic, without obvious abnormality, atraumatic Eyes: PERRL, conjunctiva/corneas clear, EOM's intact,  Ears: Normal TM's and external ear canals Nose: Nares normal, mucosa normal, no drainage or sinus tenderness Throat: Lips, mucosa, and tongue normal; teeth and gums normal Neck: Supple, no lymphadenopathy;  thyroid:  no enlargement/tenderness/nodules; no carotid bruit or JVD Lungs: Clear to auscultation bilaterally without wheezes, rales or ronchi; respirations unlabored Heart: Regular rate and rhythm, S1 and S2 normal, no murmur, rubor gallop Abdomen: Soft, non-tender, nondistended, normoactive bowel sounds,  no masses, no hepatosplenomegaly Skin:  Skin color, texture, turgor normal, no rashes or lesions Lymph  nodes: Cervical, supraclavicular, and axillary nodes normal Neurologic:  CNII-XII intact, normal strength, sensation and gait; reflexes 2+ and symmetric throughout Psych: Normal mood, affect, hygiene and grooming.  ASSESSMENT/PLAN: Coronary artery disease involving native coronary artery of native heart without angina pectoris  Osteoporosis without current pathological fracture, unspecified osteoporosis type - Plan: DG Bone Density  Hyperlipidemia with target LDL  less than 100  Family history of heart disease in female family member before age 39    Discussed continue present medications.  Will follow-up on her need for Prolia.  She will remain quite physically active.  Immunization recommendations discussed.  Colonoscopy recommendations reviewed   Medicare Attestation I have personally reviewed: The patient's medical and social history Their use of alcohol, tobacco or illicit drugs Their current medications and supplements The patient's functional ability including ADLs,fall risks, home safety risks, cognitive, and hearing and visual impairment Diet and physical activities Evidence for depression or mood disorders  The patient's weight, height, and BMI have been recorded in the chart.  I have made referrals, counseling, and provided education to the patient based on review of the above and I have provided the patient with a written personalized care plan for preventive services.     Sharlot Gowda, MD   04/10/2022

## 2022-06-02 DIAGNOSIS — M8580 Other specified disorders of bone density and structure, unspecified site: Secondary | ICD-10-CM | POA: Insufficient documentation

## 2022-06-12 ENCOUNTER — Other Ambulatory Visit: Payer: Self-pay

## 2022-06-12 NOTE — Progress Notes (Signed)
Bone density from 06/02/2022 shows Osteopenia. Per Dr. Redmond School, stop Prolia. Continue Vitamin D and Calcium. Repeat in 2 years. Patient advised.

## 2022-07-03 NOTE — Progress Notes (Signed)
HPI: Follow-up coronary artery disease and hyperlipidemia. Calcium score March 2023 274 which was 88th percentile.  ABIs October 2023 normal on the right with abnormal right toe brachial index and mild on the left with abnormal toe brachial index.  Since last seen the patient denies any dyspnea on exertion, orthopnea, PND, pedal edema, palpitations, syncope or chest pain.   Current Outpatient Medications  Medication Sig Dispense Refill   Calcium Carbonate (CALCIUM 600 PO) Take 1 tablet by mouth daily.     Calcium-Vitamin D (CALTRATE 600 PLUS-VIT D PO) Take 1 tablet by mouth daily.      cromolyn (OPTICROM) 4 % ophthalmic solution 1 drop 4 (four) times daily.     dorzolamide-timolol (COSOPT) 22.3-6.8 MG/ML ophthalmic solution INT 1 GTT IN OU BID     Eyelid Cleansers (OCUSOFT LID SCRUB EX) Apply topically.     ezetimibe (ZETIA) 10 MG tablet Take 1 tablet (10 mg total) by mouth daily. 90 tablet 3   ketoconazole (NIZORAL) 2 % cream Apply 1 Application topically daily as needed for irritation.     Multiple Vitamin (MULTIVITAMIN) capsule Take 1 capsule by mouth daily.     NON FORMULARY      ROCKLATAN 0.02-0.005 % SOLN Apply 1 drop to eye at bedtime.     rosuvastatin (CRESTOR) 40 MG tablet Take 40 mg by mouth daily.     tacrolimus (PROTOPIC) 0.1 % ointment SMARTSIG:sparingly Topical Twice Daily     tretinoin (RETIN-A) 0.1 % cream APP EXT AA Q NIGHT     Turmeric 400 MG CAPS Take 1 capsule by mouth daily.     zolpidem (AMBIEN) 5 MG tablet take 1 tablet by mouth at bedtime if needed for sleep (Patient not taking: Reported on 07/10/2022) 15 tablet 1   No current facility-administered medications for this visit.     Past Medical History:  Diagnosis Date   Coronary artery disease involving native coronary artery of native heart without angina pectoris 01/14/2022   CAC SCORE 06/27/21  IMPRESSION:  Coronary calcium score 274 is at the [place] percentile for the  patient's age, sex and race.   Glaucoma     Hyperlipidemia    Osteopenia     Past Surgical History:  Procedure Laterality Date   Cornea surgery     PATELLA FRACTURE SURGERY      Social History   Socioeconomic History   Marital status: Married    Spouse name: Not on file   Number of children: 3   Years of education: Not on file   Highest education level: Not on file  Occupational History   Not on file  Tobacco Use   Smoking status: Never   Smokeless tobacco: Never  Substance and Sexual Activity   Alcohol use: Yes    Alcohol/week: 0.0 standard drinks of alcohol    Comment: 2-3/week   Drug use: No   Sexual activity: Not on file  Other Topics Concern   Not on file  Social History Narrative   Not on file   Social Determinants of Health   Financial Resource Strain: Not on file  Food Insecurity: Not on file  Transportation Needs: Not on file  Physical Activity: Not on file  Stress: Not on file  Social Connections: Not on file  Intimate Partner Violence: Not on file    Family History  Problem Relation Age of Onset   Heart attack Mother    Heart attack Father     ROS: no fevers  or chills, productive cough, hemoptysis, dysphasia, odynophagia, melena, hematochezia, dysuria, hematuria, rash, seizure activity, orthopnea, PND, pedal edema, claudication. Remaining systems are negative.  Physical Exam: Well-developed well-nourished in no acute distress.  Skin is warm and dry.  HEENT is normal.  Neck is supple.  Chest is clear to auscultation with normal expansion.  Cardiovascular exam is regular rate and rhythm.  Abdominal exam nontender or distended. No masses palpated. Extremities show no edema. neuro grossly intact  A/P  1 coronary artery disease-based on previously elevated calcium score.  Continue aspirin and statin.  She denies chest pain.  2 peripheral vascular disease-no claudication.  Plan to continue medical therapy.  3 Hyperlipidemia-continue Crestor at present dose.  4 newly diagnosed  hypertension-blood pressure is elevated.  Add losartan 50 mg daily.  Check potassium and renal function in 1 week.  Follow blood pressure and increase medications as needed.  Kirk Ruths, MD

## 2022-07-10 ENCOUNTER — Ambulatory Visit: Payer: Medicare Other | Attending: Cardiology | Admitting: Cardiology

## 2022-07-10 ENCOUNTER — Encounter: Payer: Self-pay | Admitting: Cardiology

## 2022-07-10 VITALS — BP 162/86 | HR 58 | Ht 62.5 in | Wt 135.6 lb

## 2022-07-10 DIAGNOSIS — R030 Elevated blood-pressure reading, without diagnosis of hypertension: Secondary | ICD-10-CM | POA: Diagnosis not present

## 2022-07-10 MED ORDER — LOSARTAN POTASSIUM 50 MG PO TABS: 50.0000 mg | ORAL_TABLET | Freq: Every day | ORAL | 3 refills | Status: DC

## 2022-07-10 NOTE — Patient Instructions (Signed)
Medication Instructions:   START LOSARTAN 50 MG ONCE DAILY  *If you need a refill on your cardiac medications before your next appointment, please call your pharmacy*   Lab Work:  Your physician recommends that you return for lab work in: ONE WEEK-DO NOT NEED TO FAST  If you have labs (blood work) drawn today and your tests are completely normal, you will receive your results only by: MyChart Message (if you have MyChart) OR A paper copy in the mail If you have any lab test that is abnormal or we need to change your treatment, we will call you to review the results.    Follow-Up: At [facility], you and your health needs are our priority.  As part of our continuing mission to provide you with exceptional heart care, we have created designated Provider Care Teams.  These Care Teams include your primary Cardiologist (physician) and Advanced Practice Providers (APPs -  Physician Assistants and Nurse Practitioners) who all work together to provide you with the care you need, when you need it.  We recommend signing up for the patient portal called "MyChart".  Sign up information is provided on this After Visit Summary.  MyChart is used to connect with patients for Virtual Visits (Telemedicine).  Patients are able to view lab/test results, encounter notes, upcoming appointments, etc.  Non-urgent messages can be sent to your provider as well.   To learn more about what you can do with MyChart, go to [email or web address].    Your next appointment:   12 month(s)  Provider:   Brian Crenshaw, MD      

## 2022-07-30 LAB — BASIC METABOLIC PANEL
BUN/Creatinine Ratio: 35 — ABNORMAL HIGH (ref 12–28)
BUN: 24 mg/dL (ref 8–27)
CO2: 22 mmol/L (ref 20–29)
Calcium: 9.8 mg/dL (ref 8.7–10.3)
Chloride: 101 mmol/L (ref 96–106)
Creatinine, Ser: 0.68 mg/dL (ref 0.57–1.00)
Glucose: 95 mg/dL (ref 70–99)
Potassium: 4.6 mmol/L (ref 3.5–5.2)
Sodium: 138 mmol/L (ref 134–144)
eGFR: 94 mL/min/{1.73_m2} (ref >59)

## 2022-08-04 ENCOUNTER — Encounter: Payer: Self-pay | Admitting: *Deleted

## 2022-09-08 ENCOUNTER — Other Ambulatory Visit: Payer: Self-pay

## 2022-09-08 MED ORDER — ROSUVASTATIN CALCIUM 40 MG PO TABS: 40.0000 mg | ORAL_TABLET | Freq: Every day | ORAL | 3 refills | Status: DC

## 2022-10-14 ENCOUNTER — Telehealth: Payer: Self-pay | Admitting: Cardiology

## 2022-10-14 DIAGNOSIS — E785 Hyperlipidemia, unspecified: Secondary | ICD-10-CM

## 2022-10-14 MED ORDER — EZETIMIBE 10 MG PO TABS: 10.0000 mg | ORAL_TABLET | Freq: Every day | ORAL | 2 refills | Status: DC

## 2022-10-14 NOTE — Telephone Encounter (Signed)
*  STAT* If patient is at the pharmacy, call can be transferred to refill team.   1. Which medications need to be refilled? (please list name of each medication and dose if known) ezetimibe (ZETIA) 10 MG tablet  2. Which pharmacy/location (including street and city if local pharmacy) is medication to be sent to? [id number] - [place], [place] - [address] AT [facility] OF [address]  3. Do they need a 30 day or 90 day supply?  90 day supply  Patient states she will be travelling tomorrow morning and she is hopeful to have the medication filled before her flight if possible.

## 2022-10-14 NOTE — Telephone Encounter (Signed)
Pt's medication was sent to pt's pharmacy as requested. Confirmation received.  °

## 2022-11-11 ENCOUNTER — Ambulatory Visit: Payer: Medicare Other

## 2022-11-11 VITALS — Ht 62.0 in | Wt 125.0 lb

## 2022-11-11 DIAGNOSIS — Z0000 Encounter for general adult medical examination without abnormal findings: Secondary | ICD-10-CM

## 2022-11-11 NOTE — Progress Notes (Signed)
Subjective:   [name] [name] is a 72 y.o. female who presents for Medicare Annual (Subsequent) preventive examination.  Visit Complete: Virtual  I connected with  Snigdha Howser Claire on 11/11/22 by a audio enabled telemedicine application and verified that I am speaking with the correct person using two identifiers.  Patient Location: Home  Provider Location: Office/Clinic  I discussed the limitations of evaluation and management by telemedicine. The patient expressed  understanding and agreed to proceed.  Per patient no change in vitals since last visit, unable to obtain new vitals due to telehealth visit     Review of Systems     Cardiac Risk Factors include: advanced age (>59men, >52 women);dyslipidemia     Objective:    Today's Vitals   11/11/22 0940 11/11/22 0941  Weight: 125 lb (56.7 kg)   Height: 5\' 2"  (1.575 m)   PainSc:  3    Body mass index is 22.86 kg/m.     11/11/2022    9:50 AM 04/03/2021    1:46 PM 07/30/2016    9:27 PM  Advanced Directives  Does Patient Have a Medical Advance Directive? Yes Yes No  Type of Estate agent of [place];Living will Living will;Healthcare Power of Attorney   Does patient want to make changes to medical advance directive?  No - Patient declined   Copy of Healthcare Power of Attorney in Chart? Yes - validated most recent copy scanned in chart (See row information) Yes - validated most recent copy scanned in chart (See row information)     Current Medications (verified) Outpatient Encounter Medications as of 11/11/2022  Medication Sig   Calcium Carbonate (CALCIUM 600 PO) Take 1 tablet by mouth daily.   Calcium-Vitamin D (CALTRATE 600 PLUS-VIT D PO) Take 1 tablet by mouth daily.    dorzolamide-timolol (COSOPT) 22.3-6.8 MG/ML ophthalmic solution INT 1 GTT IN OU BID   Eyelid Cleansers (OCUSOFT LID SCRUB EX) Apply topically.   ezetimibe (ZETIA) 10 MG tablet Take 1 tablet (10 mg total) by mouth daily.   ketoconazole  (NIZORAL) 2 % cream Apply 1 Application topically daily as needed for irritation.   losartan (COZAAR) 50 MG tablet Take 1 tablet (50 mg total) by mouth daily.   Multiple Vitamin (MULTIVITAMIN) capsule Take 1 capsule by mouth daily.   NON FORMULARY    OPZELURA 1.5 % CREA Apply topically.   ROCKLATAN 0.02-0.005 % SOLN Apply 1 drop to eye at bedtime.   rosuvastatin (CRESTOR) 40 MG tablet Take 1 tablet (40 mg total) by mouth daily.   tacrolimus (PROTOPIC) 0.1 % ointment SMARTSIG:sparingly Topical Twice Daily   tretinoin (RETIN-A) 0.1 % cream APP EXT AA Q NIGHT   Turmeric 400 MG CAPS Take 1 capsule by mouth daily.   cromolyn (OPTICROM) 4 % ophthalmic solution 1 drop 4 (four) times daily. (Patient not taking: Reported on 11/11/2022)   zolpidem (AMBIEN) 5 MG tablet take 1 tablet by mouth at bedtime if needed for sleep (Patient not taking: Reported on 07/10/2022)   No facility-administered encounter medications on file as of 11/11/2022.    Allergies (verified) Patient has no known allergies.   History: Past Medical History:  Diagnosis Date   Coronary artery disease involving native coronary artery of native heart without angina pectoris 01/14/2022   CAC SCORE 06/27/21  IMPRESSION:  Coronary calcium score 274 is at the 88th percentile for the  patient's age, sex and race.   Glaucoma    Hyperlipidemia    Osteopenia  Past Surgical History:  Procedure Laterality Date   Cornea surgery     PATELLA FRACTURE SURGERY     Family History  Problem Relation Age of Onset   Heart attack Mother    Heart attack Father    Social History   Socioeconomic History   Marital status: Married    Spouse name: Not on file   Number of children: 3   Years of education: Not on file   Highest education level: Not on file  Occupational History   Not on file  Tobacco Use   Smoking status: Never   Smokeless tobacco: Never  Vaping Use   Vaping status: Never Used  Substance and Sexual Activity   Alcohol use:  Yes    Alcohol/week: 0.0 standard drinks of alcohol    Comment: 2-3/week   Drug use: No   Sexual activity: Not on file  Other Topics Concern   Not on file  Social History Narrative   Not on file   Social Determinants of Health   Financial Resource Strain: Low Risk  (11/11/2022)   Overall Financial Resource Strain (CARDIA)    Difficulty of Paying Living Expenses: Not hard at all  Food Insecurity: No Food Insecurity (11/11/2022)   Hunger Vital Sign    Worried About Running Out of Food in the Last Year: Never true    Ran Out of Food in the Last Year: Never true  Transportation Needs: No Transportation Needs (11/11/2022)   PRAPARE - Administrator, Civil Service (Medical): No    Lack of Transportation (Non-Medical): No  Physical Activity: Sufficiently Active (11/11/2022)   Exercise Vital Sign    Days of Exercise per Week: 7 days    Minutes of Exercise per Session: 60 min  Stress: No Stress Concern Present (11/11/2022)   [employer] of Occupational Health - Occupational Stress Questionnaire    Feeling of Stress : Not at all  Social Connections: Socially Integrated (11/11/2022)   Social Connection and Isolation Panel [NHANES]    Frequency of Communication with Friends and Family: More than three times a week    Frequency of Social Gatherings with Friends and Family: Twice a week    Attends Religious Services: More than 4 times per year    Active Member of [employer] or Organizations: Yes    Attends Engineer, structural: More than 4 times per year    Marital Status: Married    Tobacco Counseling Counseling given: Not Answered   Clinical Intake:  Pre-visit preparation completed: Yes  Pain : 0-10 Pain Score: 3  Pain Type: Chronic pain Pain Location: Hip (right knee) Pain Orientation: Left Pain Onset: More than a month ago Pain Frequency: Intermittent     Nutritional Status: BMI of 19-24  Normal Nutritional Risks: None Diabetes: No  How often do you  need to have someone help you when you read instructions, pamphlets, or other written materials from your doctor or pharmacy?: 1 - Never  Interpreter Needed?: No  Information entered by :: NAllen LPN   Activities of Daily Living    11/11/2022    9:42 AM  In your present state of health, do you have any difficulty performing the following activities:  Hearing? 0  Vision? 0  Difficulty concentrating or making decisions? 0  Walking or climbing stairs? 0  Dressing or bathing? 0  Doing errands, shopping? 0  Preparing Food and eating ? N  Using the Toilet? N  In the past six months, have  you accidently leaked urine? N  Do you have problems with loss of bowel control? N  Managing your Medications? N  Managing your Finances? N  Housekeeping or managing your Housekeeping? N    Patient Care Team: Ronnald Nian, MD as PCP - General (Family Medicine) Jens Som Madolyn Frieze, MD as PCP - Cardiology (Cardiology)  Indicate any recent Medical Services you may have received from other than [facility] providers in the past year (date may be approximate).     Assessment:   This is a routine wellness examination for [name] [name].  Hearing/Vision screen Hearing Screening - Comments:: Denies hearing issues Vision Screening - Comments:: Regular eye exams, [facility]  Dietary issues and exercise activities discussed:     Goals Addressed             This Visit's Progress    Patient Stated       11/11/2022, wants to lose weight       Depression Screen    11/11/2022    9:51 AM 04/10/2022    1:47 PM 04/03/2021    1:48 PM 11/17/2019    9:47 AM 03/21/2016    9:17 AM  PHQ 2/9 Scores  PHQ - 2 Score 0 0 0 0 0  PHQ- 9 Score 3        Fall Risk    11/11/2022    9:50 AM 04/10/2022    1:47 PM 04/03/2021    1:47 PM 11/17/2019    9:47 AM 03/21/2016    9:17 AM  Fall Risk   Falls in the past year? 0 0 0 0 No  Number falls in past yr: 0 0 0    Injury with Fall? 0 0 0    Risk for fall due to :  Medication side effect No Fall Risks No Fall Risks    Follow up Falls prevention discussed;Falls evaluation completed Falls evaluation completed Falls evaluation completed      MEDICARE RISK AT HOME:  Medicare Risk at Home - 11/11/22 0950     Any stairs in or around the home? Yes    If so, are there any without handrails? No    Home free of loose throw rugs in walkways, pet beds, electrical cords, etc? Yes    Adequate lighting in your home to reduce risk of falls? Yes    Life alert? No    Use of a cane, walker or w/c? No    Grab bars in the bathroom? No    Shower chair or bench in shower? No    Elevated toilet seat or a handicapped toilet? No             TIMED UP AND GO:  Was the test performed?  No    Cognitive Function:        11/11/2022    9:54 AM  6CIT Screen  What Year? 0 points  What month? 0 points  What time? 0 points  Count back from 20 0 points  Months in reverse 0 points  Repeat phrase 0 points  Total Score 0 points    Immunizations Immunization History  Administered Date(s) Administered   Fluad Quad(high Dose 65+) 01/23/2020, 01/29/2022   Influenza Split 03/10/2011   Influenza Whole 07/20/2003, 02/16/2008, 02/26/2009   Influenza-Unspecified 02/28/2015, 02/15/2016, 01/19/2019, 01/09/2021   Moderna Covid-19 Vaccine Bivalent Booster 28yrs & up 02/06/2021   Moderna SARS-COV2 Booster Vaccination 09/26/2020   PFIZER(Purple Top)SARS-COV-2 Vaccination 06/12/2019, 07/05/2019, 01/28/2020   PPD Test 06/23/2016  Pneumococcal Conjugate-13 03/10/2019   Pneumococcal Polysaccharide-23 04/03/2021   Tdap 05/04/2015   Zoster Recombinant(Shingrix) 03/10/2019, 05/09/2019   Zoster, Live 05/04/2015    TDAP status: Up to date  Flu Vaccine status: Up to date  Pneumococcal vaccine status: Up to date  Covid-19 vaccine status: Completed vaccines  Qualifies for Shingles Vaccine? Yes   Zostavax completed Yes   Shingrix Completed?: Yes  Screening Tests Health  Maintenance  Topic Date Due   COVID-19 Vaccine (6 - 2023-24 season) 12/27/2021   INFLUENZA VACCINE  11/27/2022   Medicare Annual Wellness (AWV)  11/11/2023   MAMMOGRAM  05/14/2024   DTaP/Tdap/Td (2 - Td or Tdap) 05/03/2025   Colonoscopy  07/04/2028   Pneumonia Vaccine 28+ Years old  Completed   DEXA SCAN  Completed   Hepatitis C Screening  Completed   Zoster Vaccines- Shingrix  Completed   HPV VACCINES  Aged Out    Health Maintenance  Health Maintenance Due  Topic Date Due   COVID-19 Vaccine (6 - 2023-24 season) 12/27/2021    Colorectal cancer screening: Type of screening: Colonoscopy. Completed 07/05/2018. Repeat every 10 years  Mammogram status: Completed 05/14/2022. Repeat every year  Bone Density status: Completed 06/02/2022.   Lung Cancer Screening: (Low Dose CT Chest recommended if Age 36-80 years, 20 pack-year currently smoking OR have quit w/in 15years.) does not qualify.   Lung Cancer Screening Referral: no  Additional Screening:  Hepatitis C Screening: does qualify; Completed 05/04/2015  Vision Screening: Recommended annual ophthalmology exams for early detection of glaucoma and other disorders of the eye. Is the patient up to date with their annual eye exam?  Yes  Who is the provider or what is the name of the office in which the patient attends annual eye exams? [facility] If pt is not established with a provider, would they like to be referred to a provider to establish care? No .   Dental Screening: Recommended annual dental exams for proper oral hygiene  Diabetic Foot Exam: n/a  Community Resource Referral / Chronic Care Management: CRR required this visit?  No   CCM required this visit?  No     Plan:     I have personally reviewed and noted the following in the patient's chart:   Medical and social history Use of alcohol, tobacco or illicit drugs  Current medications and supplements including opioid prescriptions. Patient is not currently taking  opioid prescriptions. Functional ability and status Nutritional status Physical activity Advanced directives List of other physicians Hospitalizations, surgeries, and ER visits in previous 12 months Vitals Screenings to include cognitive, depression, and falls Referrals and appointments  In addition, I have reviewed and discussed with patient certain preventive protocols, quality metrics, and best practice recommendations. A written personalized care plan for preventive services as well as general preventive health recommendations were provided to patient.     Barb Merino, LPN   05/03/2692   After Visit Summary: (MyChart) Due to this being a telephonic visit, the after visit summary with patients personalized plan was offered to patient via MyChart   Nurse Notes: none

## 2022-11-11 NOTE — Patient Instructions (Signed)
[name] [name] , Thank you for taking time to come for your Medicare Wellness Visit. I appreciate your ongoing commitment to your health goals. Please review the following plan we discussed and let me know if I can assist you in the future.   These are the goals we discussed:  Goals      Patient Stated     11/11/2022, wants to lose weight        This is a list of the screening recommended for you and due dates:  Health Maintenance  Topic Date Due   COVID-19 Vaccine (6 - 2023-24 season) 12/27/2021   Flu Shot  11/27/2022   Medicare Annual Wellness Visit  11/11/2023   Mammogram  05/14/2024   DTaP/Tdap/Td vaccine (2 - Td or Tdap) 05/03/2025   Colon Cancer Screening  07/04/2028   Pneumonia Vaccine  Completed   DEXA scan (bone density measurement)  Completed   Hepatitis C Screening  Completed   Zoster (Shingles) Vaccine  Completed   HPV Vaccine  Aged Out    Advanced directives: copy in chart  Conditions/risks identified: none  Next appointment: Follow up in one year for your annual wellness visit    Preventive Care 65 Years and Older, Female Preventive care refers to lifestyle choices and visits with your health care provider that can promote health and wellness. What does preventive care include? A yearly physical exam. This is also called an annual well check. Dental exams once or twice a year. Routine eye exams. Ask your health care provider how often you should have your eyes checked. Personal lifestyle choices, including: Daily care of your teeth and gums. Regular physical activity. Eating a healthy diet. Avoiding tobacco and drug use. Limiting alcohol use. Practicing safe sex. Taking low-dose aspirin every day. Taking vitamin and mineral supplements as recommended by your health care provider. What happens during an annual well check? The services and screenings done by your health care provider during your annual well check will depend on your age, overall health,  lifestyle risk factors, and family history of disease. Counseling  Your health care provider may ask you questions about your: Alcohol use. Tobacco use. Drug use. Emotional well-being. Home and relationship well-being. Sexual activity. Eating habits. History of falls. Memory and ability to understand (cognition). Work and work Astronomer. Reproductive health. Screening  You may have the following tests or measurements: Height, weight, and BMI. Blood pressure. Lipid and cholesterol levels. These may be checked every 5 years, or more frequently if you are over 63 years old. Skin check. Lung cancer screening. You may have this screening every year starting at age 5 if you have a 30-pack-year history of smoking and currently smoke or have quit within the past 15 years. Fecal occult blood test (FOBT) of the stool. You may have this test every year starting at age 37. Flexible sigmoidoscopy or colonoscopy. You may have a sigmoidoscopy every 5 years or a colonoscopy every 10 years starting at age 52. Hepatitis C blood test. Hepatitis B blood test. Sexually transmitted disease (STD) testing. Diabetes screening. This is done by checking your blood sugar (glucose) after you have not eaten for a while (fasting). You may have this done every 1-3 years. Bone density scan. This is done to screen for osteoporosis. You may have this done starting at age 23. Mammogram. This may be done every 1-2 years. Talk to your health care provider about how often you should have regular mammograms. Talk with your health care provider about  your test results, treatment options, and if necessary, the need for more tests. Vaccines  Your health care provider may recommend certain vaccines, such as: Influenza vaccine. This is recommended every year. Tetanus, diphtheria, and acellular pertussis (Tdap, Td) vaccine. You may need a Td booster every 10 years. Zoster vaccine. You may need this after age  54. Pneumococcal 13-valent conjugate (PCV13) vaccine. One dose is recommended after age 68. Pneumococcal polysaccharide (PPSV23) vaccine. One dose is recommended after age 9. Talk to your health care provider about which screenings and vaccines you need and how often you need them. This information is not intended to replace advice given to you by your health care provider. Make sure you discuss any questions you have with your health care provider. Document Released: 05/11/2015 Document Revised: 01/02/2016 Document Reviewed: 02/13/2015 Elsevier Interactive Patient Education  2017 [employer].  Fall Prevention in the Home Falls can cause injuries. They can happen to people of all ages. There are many things you can do to make your home safe and to help prevent falls. What can I do on the outside of my home? Regularly fix the edges of walkways and driveways and fix any cracks. Remove anything that might make you trip as you walk through a door, such as a raised step or threshold. Trim any bushes or trees on the path to your home. Use bright outdoor lighting. Clear any walking paths of anything that might make someone trip, such as rocks or tools. Regularly check to see if handrails are loose or broken. Make sure that both sides of any steps have handrails. Any raised decks and porches should have guardrails on the edges. Have any leaves, snow, or ice cleared regularly. Use sand or salt on walking paths during winter. Clean up any spills in your garage right away. This includes oil or grease spills. What can I do in the bathroom? Use night lights. Install grab bars by the toilet and in the tub and shower. Do not use towel bars as grab bars. Use non-skid mats or decals in the tub or shower. If you need to sit down in the shower, use a plastic, non-slip stool. Keep the floor dry. Clean up any water that spills on the floor as soon as it happens. Remove soap buildup in the tub or shower  regularly. Attach bath mats securely with double-sided non-slip rug tape. Do not have throw rugs and other things on the floor that can make you trip. What can I do in the bedroom? Use night lights. Make sure that you have a light by your bed that is easy to reach. Do not use any sheets or blankets that are too big for your bed. They should not hang down onto the floor. Have a firm chair that has side arms. You can use this for support while you get dressed. Do not have throw rugs and other things on the floor that can make you trip. What can I do in the kitchen? Clean up any spills right away. Avoid walking on wet floors. Keep items that you use a lot in easy-to-reach places. If you need to reach something above you, use a strong step stool that has a grab bar. Keep electrical cords out of the way. Do not use floor polish or wax that makes floors slippery. If you must use wax, use non-skid floor wax. Do not have throw rugs and other things on the floor that can make you trip. What can I do with  my stairs? Do not leave any items on the stairs. Make sure that there are handrails on both sides of the stairs and use them. Fix handrails that are broken or loose. Make sure that handrails are as long as the stairways. Check any carpeting to make sure that it is firmly attached to the stairs. Fix any carpet that is loose or worn. Avoid having throw rugs at the top or bottom of the stairs. If you do have throw rugs, attach them to the floor with carpet tape. Make sure that you have a light switch at the top of the stairs and the bottom of the stairs. If you do not have them, ask someone to add them for you. What else can I do to help prevent falls? Wear shoes that: Do not have high heels. Have rubber bottoms. Are comfortable and fit you well. Are closed at the toe. Do not wear sandals. If you use a stepladder: Make sure that it is fully opened. Do not climb a closed stepladder. Make sure that  both sides of the stepladder are locked into place. Ask someone to hold it for you, if possible. Clearly mark and make sure that you can see: Any grab bars or handrails. First and last steps. Where the edge of each step is. Use tools that help you move around (mobility aids) if they are needed. These include: Canes. Walkers. Scooters. Crutches. Turn on the lights when you go into a dark area. Replace any light bulbs as soon as they burn out. Set up your furniture so you have a clear path. Avoid moving your furniture around. If any of your floors are uneven, fix them. If there are any pets around you, be aware of where they are. Review your medicines with your doctor. Some medicines can make you feel dizzy. This can increase your chance of falling. Ask your doctor what other things that you can do to help prevent falls. This information is not intended to replace advice given to you by your health care provider. Make sure you discuss any questions you have with your health care provider. Document Released: 02/08/2009 Document Revised: 09/20/2015 Document Reviewed: 05/19/2014 Elsevier Interactive Patient Education  2017 [employer].

## 2023-04-14 ENCOUNTER — Encounter: Payer: Self-pay | Admitting: Family Medicine

## 2023-04-14 ENCOUNTER — Ambulatory Visit (INDEPENDENT_AMBULATORY_CARE_PROVIDER_SITE_OTHER): Payer: Medicare Other | Admitting: Family Medicine

## 2023-04-14 VITALS — BP 130/80 | HR 50 | Ht 62.0 in | Wt 133.2 lb

## 2023-04-14 DIAGNOSIS — Z0000 Encounter for general adult medical examination without abnormal findings: Secondary | ICD-10-CM

## 2023-04-14 DIAGNOSIS — Z8249 Family history of ischemic heart disease and other diseases of the circulatory system: Secondary | ICD-10-CM | POA: Diagnosis not present

## 2023-04-14 DIAGNOSIS — G479 Sleep disorder, unspecified: Secondary | ICD-10-CM

## 2023-04-14 DIAGNOSIS — R030 Elevated blood-pressure reading, without diagnosis of hypertension: Secondary | ICD-10-CM

## 2023-04-14 DIAGNOSIS — E785 Hyperlipidemia, unspecified: Secondary | ICD-10-CM

## 2023-04-14 DIAGNOSIS — I2510 Atherosclerotic heart disease of native coronary artery without angina pectoris: Secondary | ICD-10-CM

## 2023-04-14 DIAGNOSIS — M8580 Other specified disorders of bone density and structure, unspecified site: Secondary | ICD-10-CM

## 2023-04-14 DIAGNOSIS — R001 Bradycardia, unspecified: Secondary | ICD-10-CM

## 2023-04-14 LAB — LIPID PANEL

## 2023-04-14 MED ORDER — ZOLPIDEM TARTRATE 5 MG PO TABS
5.0000 mg | ORAL_TABLET | Freq: Every evening | ORAL | 1 refills | Status: AC | PRN
Start: 2023-04-14 — End: ?

## 2023-04-14 NOTE — Patient Instructions (Signed)
Insomnia Insomnia is a sleep disorder that makes it difficult to fall asleep or stay asleep. Insomnia can cause fatigue, low energy, difficulty concentrating, mood swings, and poor performance at work or school. There are three different ways to classify insomnia: Difficulty falling asleep. Difficulty staying asleep. Waking up too early in the morning. Any type of insomnia can be long-term (chronic) or short-term (acute). Both are common. Short-term insomnia usually lasts for 3 months or less. Chronic insomnia occurs at least three times a week for longer than 3 months. What are the causes? Insomnia may be caused by another condition, situation, or substance, such as: Having certain mental health conditions, such as anxiety and depression. Using caffeine, alcohol, tobacco, or drugs. Having gastrointestinal conditions, such as gastroesophageal reflux disease (GERD). Having certain medical conditions. These include: Asthma. Alzheimer's disease. Stroke. Chronic pain. An overactive thyroid gland (hyperthyroidism). Other sleep disorders, such as restless legs syndrome and sleep apnea. Menopause. Sometimes, the cause of insomnia may not be known. What increases the risk? Risk factors for insomnia include: Gender. Females are affected more often than males. Age. Insomnia is more common as people get older. Stress and certain medical and mental health conditions. Lack of exercise. Having an irregular work schedule. This may include working night shifts and traveling between different time zones. What are the signs or symptoms? If you have insomnia, the main symptom is having trouble falling asleep or having trouble staying asleep. This may lead to other symptoms, such as: Feeling tired or having low energy. Feeling nervous about going to sleep. Not feeling rested in the morning. Having trouble concentrating. Feeling irritable, anxious, or depressed. How is this diagnosed? This condition  may be diagnosed based on: Your symptoms and medical history. Your health care provider may ask about: Your sleep habits. Any medical conditions you have. Your mental health. A physical exam. How is this treated? Treatment for insomnia depends on the cause. Treatment may focus on treating an underlying condition that is causing the insomnia. Treatment may also include: Medicines to help you sleep. Counseling or therapy. Lifestyle adjustments to help you sleep better. Follow these instructions at home: Eating and drinking  Limit or avoid alcohol, caffeinated beverages, and products that contain nicotine and tobacco, especially close to bedtime. These can disrupt your sleep. Do not eat a large meal or eat spicy foods right before bedtime. This can lead to digestive discomfort that can make it hard for you to sleep. Sleep habits  Keep a sleep diary to help you and your health care provider figure out what could be causing your insomnia. Write down: When you sleep. When you wake up during the night. How well you sleep and how rested you feel the next day. Any side effects of medicines you are taking. What you eat and drink. Make your bedroom a dark, comfortable place where it is easy to fall asleep. Put up shades or blackout curtains to block light from outside. Use a white noise machine to block noise. Keep the temperature cool. Limit screen use before bedtime. This includes: Not watching TV. Not using your smartphone, tablet, or computer. Stick to a routine that includes going to bed and waking up at the same times every day and night. This can help you fall asleep faster. Consider making a quiet activity, such as reading, part of your nighttime routine. Try to avoid taking naps during the day so that you sleep better at night. Get out of bed if you are still awake after   15 minutes of trying to sleep. Keep the lights down, but try reading or doing a quiet activity. When you feel  sleepy, go back to bed. General instructions Take over-the-counter and prescription medicines only as told by your health care provider. Exercise regularly as told by your health care provider. However, avoid exercising in the hours right before bedtime. Use relaxation techniques to manage stress. Ask your health care provider to suggest some techniques that may work well for you. These may include: Breathing exercises. Routines to release muscle tension. Visualizing peaceful scenes. Make sure that you drive carefully. Do not drive if you feel very sleepy. Keep all follow-up visits. This is important. Contact a health care provider if: You are tired throughout the day. You have trouble in your daily routine due to sleepiness. You continue to have sleep problems, or your sleep problems get worse. Get help right away if: You have thoughts about hurting yourself or someone else. Get help right away if you feel like you may hurt yourself or others, or have thoughts about taking your own life. Go to your nearest emergency room or: Call 911. Call the National Suicide Prevention Lifeline at 1-800-273-8255 or 988. This is open 24 hours a day. Text the Crisis Text Line at 741741. Summary Insomnia is a sleep disorder that makes it difficult to fall asleep or stay asleep. Insomnia can be long-term (chronic) or short-term (acute). Treatment for insomnia depends on the cause. Treatment may focus on treating an underlying condition that is causing the insomnia. Keep a sleep diary to help you and your health care provider figure out what could be causing your insomnia. This information is not intended to replace advice given to you by your health care provider. Make sure you discuss any questions you have with your health care provider. Document Revised: 03/25/2021 Document Reviewed: 03/25/2021 Elsevier Patient Education  2024 Elsevier Inc.  

## 2023-04-14 NOTE — Progress Notes (Signed)
Complete physical exam  Patient: [name] [name]   DOB: 1950-05-31   72 y.o. Female  MRN: [id number]  Subjective:    Chief Complaint  Patient presents with   Annual Exam    Fasting (blood in lab) annual exam. Patient had AWV 11/11/22. No new concerns. Would like refill on ambien.    [name] [name] is a 72 y.o. female who presents today for a complete physical exam.  She reports consuming a pescatarian diet.  Has been working out at [employer] since fx right ankle.  She generally feels well. She reports sleeping poorly.  She continues on Zetia as well as Crestor.  She has been seen by cardiology in the past because of underlying cardiovascular issues.  Continues on losartan.  Does have a family history of heart disease.  She also was recently diagnosed with osteopenia after she was treated for Prolia.  She is now taking a multivitamin and calcium.  Psychologically she is doing fairly well.  She did have 3 children but now only 1 is still alive.  The other 2 died over the last several years.  She seems to have handled this fairly well.  Her marriage is going well.  Her immunizations were reviewed.  Most recent fall risk assessment:    04/14/2023    2:46 PM  Fall Risk   Falls in the past year? 1  Number falls in past yr: 0  Comment Oct 2024  Injury with Fall? 1  Comment hairline fx right ankle  Risk for fall due to : No Fall Risks  Follow up Falls evaluation completed     Most recent depression screenings:    04/14/2023    2:47 PM 11/11/2022    9:51 AM  PHQ 2/9 Scores  PHQ - 2 Score 0 0  PHQ- 9 Score  3    Vision:Within last year 2024 Last Colonoscopy: 2020      Immunization History  Administered Date(s) Administered   Fluad Quad(high Dose 65+) 01/23/2020, 01/29/2022   Influenza Split 03/10/2011   Influenza Whole 07/20/2003, 02/16/2008, 02/26/2009   Influenza, High Dose Seasonal PF 01/26/2023   Influenza-Unspecified 02/28/2015, 02/15/2016, 01/19/2019, 01/09/2021   Moderna  Covid-19 Vaccine Bivalent Booster 59yrs & up 02/06/2021   Moderna SARS-COV2 Booster Vaccination 09/26/2020   PFIZER(Purple Top)SARS-COV-2 Vaccination 06/12/2019, 07/05/2019, 01/28/2020   PPD Test 06/23/2016   Pfizer(Comirnaty)Fall Seasonal Vaccine 12 years and older 01/26/2023   Pneumococcal Conjugate-13 03/10/2019   Pneumococcal Polysaccharide-23 04/03/2021   Respiratory Syncytial Virus Vaccine,Recomb Aduvanted(Arexvy) 04/30/2022   Tdap 05/04/2015   Zoster Recombinant(Shingrix) 03/10/2019, 05/09/2019   Zoster, Live 05/04/2015    Health Maintenance  Topic Date Due   Medicare Annual Wellness (AWV)  11/11/2023   MAMMOGRAM  05/14/2024   DTaP/Tdap/Td (2 - Td or Tdap) 05/03/2025   Colonoscopy  07/04/2028   Pneumonia Vaccine 82+ Years old  Completed   INFLUENZA VACCINE  Completed   DEXA SCAN  Completed   COVID-19 Vaccine  Completed   Hepatitis C Screening  Completed   Zoster Vaccines- Shingrix  Completed   HPV VACCINES  Aged Out    Patient Care Team: Ronnald Nian, MD as PCP - General (Family Medicine) Lewayne Bunting, MD as PCP - Cardiology (Cardiology)  Ortho: Dr. Turner Daniels, [facility] Optho: Dr. Sallye Lat GI:Dr. Outlaw Dentist: Dr Delane Ginger Outpatient Medications Prior to Visit  Medication Sig Note   Calcium-Vitamin D (CALTRATE 600 PLUS-VIT D PO) Take 1 tablet by mouth daily.  dorzolamide-timolol (COSOPT) 22.3-6.8 MG/ML ophthalmic solution INT 1 GTT IN OU BID    Eyelid Cleansers (OCUSOFT LID SCRUB EX) Apply topically.    ezetimibe (ZETIA) 10 MG tablet Take 1 tablet (10 mg total) by mouth daily.    losartan (COZAAR) 50 MG tablet Take 1 tablet (50 mg total) by mouth daily.    Multiple Vitamin (MULTIVITAMIN) capsule Take 1 capsule by mouth daily.    NON FORMULARY     OPZELURA 1.5 % CREA Apply topically.    ROCKLATAN 0.02-0.005 % SOLN Apply 1 drop to eye at bedtime.    rosuvastatin (CRESTOR) 40 MG tablet Take 1 tablet (40 mg total) by mouth daily.    tretinoin  (RETIN-A) 0.1 % cream APP EXT AA Q NIGHT    Turmeric 400 MG CAPS Take 1 capsule by mouth daily.    [DISCONTINUED] Calcium Carbonate (CALCIUM 600 PO) Take 1 tablet by mouth daily.    [DISCONTINUED] tacrolimus (PROTOPIC) 0.1 % ointment SMARTSIG:sparingly Topical Twice Daily    ketoconazole (NIZORAL) 2 % cream Apply 1 Application topically daily as needed for irritation. (Patient not taking: Reported on 04/14/2023) 04/14/2023: As needed   [DISCONTINUED] cromolyn (OPTICROM) 4 % ophthalmic solution 1 drop 4 (four) times daily. (Patient not taking: Reported on 11/11/2022)    [DISCONTINUED] zolpidem (AMBIEN) 5 MG tablet take 1 tablet by mouth at bedtime if needed for sleep (Patient not taking: No sig reported) 04/14/2023: As needed, rarely   No facility-administered medications prior to visit.    Review of Systems  All other systems reviewed and are negative.   Family and social history as well as health maintenance and immunizations was reviewed.     Objective:    BP 130/80   Pulse (!) 50   Ht 5\' 2"  (1.575 m)   Wt 133 lb 3.2 oz (60.4 kg)   BMI 24.36 kg/m    Physical Exam  Alert and in no distress. Tympanic membranes and canals are normal. Pharyngeal area is normal. Neck is supple without adenopathy or thyromegaly. Cardiac exam shows a regular sinus rhythm without murmurs or gallops. Lungs are clear to auscultation.      Assessment & Plan:     Routine general medical examination at a health care facility - Plan: CBC with Differential/Platelet, Comprehensive metabolic panel, Lipid panel  Coronary artery disease involving native coronary artery of native heart without angina pectoris - Plan: CBC with Differential/Platelet, Comprehensive metabolic panel, Lipid panel  Elevated blood pressure reading in office without diagnosis of hypertension  Family history of heart disease in female family member before age 33  Hyperlipidemia with target LDL less than 100 - Plan: Lipid  panel  Osteopenia, unspecified location  Sinus bradycardia  Sleep disturbance - Plan: zolpidem (AMBIEN) 5 MG tablet  Sleep hygiene information given concerning help with sleep.  Also discussed counseling and in the past she has had that for all the trauma of losing 2 of her children.  She will continue on her present medication regimen.  Discussed health benefits of physical activity, and encouraged her to engage in regular exercise appropriate for her age and condition.      Sharlot Gowda, MD

## 2023-04-15 ENCOUNTER — Encounter: Payer: Self-pay | Admitting: Family Medicine

## 2023-04-15 LAB — CBC WITH DIFFERENTIAL/PLATELET
Basophils Absolute: 0 10*3/uL (ref 0.0–0.2)
Basos: 1 %
EOS (ABSOLUTE): 0.1 10*3/uL (ref 0.0–0.4)
Eos: 3 %
Hematocrit: 40.6 % (ref 34.0–46.6)
Hemoglobin: 13.1 g/dL (ref 11.1–15.9)
Immature Grans (Abs): 0 10*3/uL (ref 0.0–0.1)
Immature Granulocytes: 0 %
Lymphocytes Absolute: 1.2 10*3/uL (ref 0.7–3.1)
Lymphs: 26 %
MCH: 30.2 pg (ref 26.6–33.0)
MCHC: 32.3 g/dL (ref 31.5–35.7)
MCV: 94 fL (ref 79–97)
Monocytes Absolute: 0.6 10*3/uL (ref 0.1–0.9)
Monocytes: 12 %
Neutrophils Absolute: 2.7 10*3/uL (ref 1.4–7.0)
Neutrophils: 58 %
Platelets: 168 10*3/uL (ref 150–450)
RBC: 4.34 x10E6/uL (ref 3.77–5.28)
RDW: 11.5 % — ABNORMAL LOW (ref 11.7–15.4)
WBC: 4.5 10*3/uL (ref 3.4–10.8)

## 2023-04-15 LAB — LIPID PANEL
Cholesterol, Total: 147 mg/dL (ref 100–199)
HDL: 69 mg/dL (ref >39)
LDL CALC COMMENT:: 2.1 ratio (ref 0.0–4.4)
LDL Chol Calc (NIH): 65 mg/dL (ref 0–99)
Triglycerides: 61 mg/dL (ref 0–149)
VLDL Cholesterol Cal: 13 mg/dL (ref 5–40)

## 2023-04-15 LAB — COMPREHENSIVE METABOLIC PANEL
ALT: 27 IU/L (ref 0–32)
AST: 28 IU/L (ref 0–40)
Albumin: 4.5 g/dL (ref 3.9–4.9)
Alkaline Phosphatase: 66 IU/L (ref 44–121)
BUN/Creatinine Ratio: 25 (ref 12–28)
BUN: 20 mg/dL (ref 8–27)
Bilirubin Total: 0.7 mg/dL (ref 0.0–1.2)
CO2: 24 mmol/L (ref 20–29)
Calcium: 9.5 mg/dL (ref 8.7–10.3)
Chloride: 102 mmol/L (ref 96–106)
Creatinine, Ser: 0.8 mg/dL (ref 0.57–1.00)
Globulin, Total: 1.7 g/dL (ref 1.5–4.5)
Glucose: 99 mg/dL (ref 70–99)
Potassium: 4.7 mmol/L (ref 3.5–5.2)
Sodium: 141 mmol/L (ref 134–144)
Total Protein: 6.2 g/dL (ref 6.0–8.5)
eGFR: 79 mL/min/{1.73_m2} (ref >59)

## 2023-06-10 ENCOUNTER — Encounter: Payer: Self-pay | Admitting: Family Medicine

## 2023-06-10 ENCOUNTER — Ambulatory Visit: Payer: Medicare Other | Admitting: Family Medicine

## 2023-06-10 VITALS — BP 130/82 | HR 53 | Temp 98.0°F | Wt 134.6 lb

## 2023-06-10 DIAGNOSIS — I2510 Atherosclerotic heart disease of native coronary artery without angina pectoris: Secondary | ICD-10-CM | POA: Diagnosis not present

## 2023-06-10 DIAGNOSIS — E785 Hyperlipidemia, unspecified: Secondary | ICD-10-CM

## 2023-06-10 DIAGNOSIS — Z8249 Family history of ischemic heart disease and other diseases of the circulatory system: Secondary | ICD-10-CM

## 2023-06-10 NOTE — Progress Notes (Signed)
   Subjective:    Patient ID: [name] [name], female    DOB: 08/09/1950, 73 y.o.   MRN: [id number]  HPI She is here mainly to discuss cardiac risk.  She is interested in having a coronary calcium score.  Review of the record indicates that she is already had 1 and indeed did follow-up with cardiology concerning that.  Presently she does not complain of chest pain, shortness of breath, PND or DOE.  She continues on her present medication regimen.   Review of Systems     Objective:    Physical Exam Alert and in no distress otherwise not examined.       Assessment & Plan:  Coronary artery disease involving native coronary artery of native heart without angina pectoris  Hyperlipidemia with target LDL less than 100  Family history of heart disease in female family member before age 33 I discussed the coronary calcium score with her again and discussed the fact that keeping her blood pressure and cholesterol numbers under control is the main issue and if she has any cardiac related symptoms, let me know.  She was comfortable with all this.

## 2023-06-23 ENCOUNTER — Encounter: Payer: Self-pay | Admitting: Internal Medicine

## 2023-07-06 ENCOUNTER — Ambulatory Visit: Payer: No Typology Code available for payment source | Admitting: Adult Health

## 2023-07-16 ENCOUNTER — Other Ambulatory Visit: Payer: Self-pay

## 2023-07-16 MED ORDER — ROSUVASTATIN CALCIUM 40 MG PO TABS: 40.0000 mg | ORAL_TABLET | Freq: Every day | ORAL | 0 refills | Status: DC

## 2023-07-23 ENCOUNTER — Other Ambulatory Visit: Payer: Self-pay

## 2023-07-23 DIAGNOSIS — R030 Elevated blood-pressure reading, without diagnosis of hypertension: Secondary | ICD-10-CM

## 2023-07-23 MED ORDER — LOSARTAN POTASSIUM 50 MG PO TABS
50.0000 mg | ORAL_TABLET | Freq: Every day | ORAL | 0 refills | Status: DC
Start: 2023-07-23 — End: 2023-09-24

## 2023-08-14 ENCOUNTER — Ambulatory Visit: Attending: Emergency Medicine | Admitting: Emergency Medicine

## 2023-08-14 ENCOUNTER — Encounter: Payer: Self-pay | Admitting: Emergency Medicine

## 2023-08-14 ENCOUNTER — Ambulatory Visit: Admitting: Adult Health

## 2023-08-14 VITALS — BP 136/76 | HR 47 | Ht 63.0 in | Wt 133.0 lb

## 2023-08-14 DIAGNOSIS — E785 Hyperlipidemia, unspecified: Secondary | ICD-10-CM | POA: Diagnosis not present

## 2023-08-14 DIAGNOSIS — I739 Peripheral vascular disease, unspecified: Secondary | ICD-10-CM | POA: Diagnosis not present

## 2023-08-14 DIAGNOSIS — I10 Essential (primary) hypertension: Secondary | ICD-10-CM

## 2023-08-14 DIAGNOSIS — I2510 Atherosclerotic heart disease of native coronary artery without angina pectoris: Secondary | ICD-10-CM

## 2023-08-14 DIAGNOSIS — R001 Bradycardia, unspecified: Secondary | ICD-10-CM

## 2023-08-14 NOTE — Patient Instructions (Addendum)
 Medication Instructions:  NO CHANGES   Lab Work: NONE   Testing/Procedures: NONE  Follow-Up: At [facility], you and your health needs are our priority.  As part of our continuing mission to provide you with exceptional heart care, our providers are all part of one team.  This team includes your primary Cardiologist (physician) and Advanced Practice Providers or APPs (Physician Assistants and Nurse Practitioners) who all work together to provide you with the care you need, when you need it.  Your next appointment:   1 YEAR  Provider:   Alexandria Angel, MD    We recommend signing up for the patient portal called "MyChart".  Sign up information is provided on this After Visit Summary.  MyChart is used to connect with patients for Virtual Visits (Telemedicine).  Patients are able to view lab/test results, encounter notes, upcoming appointments, etc.  Non-urgent messages can be sent to your provider as well.   To learn more about what you can do with MyChart, go to [email or web address].   Other Instructions:    1st Floor: - Lobby - Registration  - Pharmacy  - Lab - Cafe  2nd Floor: - PV Lab - Diagnostic Testing (echo, CT, nuclear med)  3rd Floor: - Vacant  4th Floor: - TCTS (cardiothoracic surgery) - AFib Clinic - Structural Heart Clinic - Vascular Surgery  - Vascular Ultrasound  5th Floor: - HeartCare Cardiology (general and EP) - Clinical Pharmacy for coumadin, hypertension, lipid, weight-loss medications, and med management appointments    Valet parking services will be available as well.

## 2023-08-14 NOTE — Progress Notes (Signed)
 Cardiology Office Note:    Date:  08/14/2023  ID:  [name], [name] [birth year], MRN [id number] PCP: [name] Norleen BROCKS, MD  [facility] Providers Cardiologist:  Redell Shallow, MD       Patient Profile:      Chief Complaint: 1 year follow-up for coronary artery disease  History of Present Illness:  [name] [name] is a 73 y.o. female with visit-pertinent history of coronary artery disease, hyperlipidemia, peripheral vascular disease, bradycardia, hyperlipidemia  Patient established with cardiology service on 07/15/2021 for evaluation of coronary artery disease.  Coronary calcium  score March 2023 was 274 (88th percentile).  ABI's October 2023 normal on the right with abnormal right toe brachial index and mild on the left with abnormal toe brachial index.  Patient was last seen in clinic on 07/10/2022.  Patient was doing well from cardiac standpoint without anginal symptoms.  Back blood pressure was elevated at 162/86.  Patient was started on losartan  50 mg daily.  She was to follow back up in office in 1 year.   Discussed the use of AI scribe software for clinical note transcription with the patient, who gave verbal consent to proceed.  History of Present Illness The patient presents to clinic today for a routine follow-up visit. The patient has been on losartan  for blood pressure control, and Zetia  and rosuvastatin  for cholesterol management. The patient reports adherence to these medications and has not experienced any adverse effects. The patient's LDL cholesterol level was last checked in December and was well-controlled at 65.  The patient leads an active lifestyle, enjoying outdoor activities such as gardening and hiking. Despite a previous knee injury that limits certain activities, the patient maintains regular physical activity. The patient follows a pescetarian diet and attributes her high cholesterol to hereditary factors.  The patient's blood pressure today was slightly  elevated.  She notes she did not take her losartan  this morning.  She is not currently taking her blood pressure daily at home.  Otherwise her blood pressures have been stable during her regular follow-up with her PCP.  Review of systems:  Please see the history of present illness. All other systems are reviewed and otherwise negative.     Home Medications:    Current Meds  Medication Sig   Calcium -Vitamin D  (CALTRATE 600 PLUS-VIT D PO) Take 1 tablet by mouth daily.    dorzolamide-timolol (COSOPT) 22.3-6.8 MG/ML ophthalmic solution INT 1 GTT IN OU BID   Doxycycline  Hyclate 50 MG TABS Take 50 mg by mouth as needed.   Eyelid Cleansers (OCUSOFT LID SCRUB EX) Apply topically.   ezetimibe  (ZETIA ) 10 MG tablet Take 1 tablet (10 mg total) by mouth daily.   ketoconazole (NIZORAL) 2 % cream Apply 1 Application topically daily as needed for irritation.   losartan  (COZAAR ) 50 MG tablet Take 1 tablet (50 mg total) by mouth daily.   MELOXICAM  PO Take by mouth.   Multiple Vitamin (MULTIVITAMIN) capsule Take 1 capsule by mouth daily.   NON FORMULARY    OPZELURA 1.5 % CREA Apply topically.   ROCKLATAN 0.02-0.005 % SOLN Apply 1 drop to eye at bedtime.   rosuvastatin  (CRESTOR ) 40 MG tablet Take 1 tablet (40 mg total) by mouth daily.   tacrolimus (PROTOPIC) 0.1 % ointment Apply topically.   tretinoin (RETIN-A) 0.1 % cream APP EXT AA Q NIGHT   Turmeric 400 MG CAPS Take 1 capsule by mouth daily.   zolpidem  (AMBIEN ) 5 MG tablet Take 1 tablet (5 mg total) by  mouth at bedtime as needed for sleep.   Studies Reviewed:   EKG Interpretation Date/Time:  Friday August 14 2023 08:04:54 EDT Ventricular Rate:  47 PR Interval:  166 QRS Duration:  76 QT Interval:  446 QTC Calculation: 394 R Axis:   26  Text Interpretation: Sinus bradycardia When compared with ECG of 30-Jul-2016 22:22, PREVIOUS ECG IS PRESENT Confirmed by Rana Dixon [phone]) on 08/14/2023 8:09:17 AM    CT cardiac scoring 06/27/2021 Coronary  calcium  score 274 is at the 88th percentile for the patient's age, sex and race. Risk Assessment/Calculations:             Physical Exam:   VS:  BP 136/76 (BP Location: Right Arm, Patient Position: Sitting, Cuff Size: Normal)   Pulse (!) 47   Ht 5' 3 (1.6 m)   Wt 133 lb (60.3 kg)   SpO2 96%   BMI 23.56 kg/m    Wt Readings from Last 3 Encounters:  08/14/23 133 lb (60.3 kg)  06/10/23 134 lb 9.6 oz (61.1 kg)  04/14/23 133 lb 3.2 oz (60.4 kg)    GEN: Well nourished, well developed in no acute distress NECK: No JVD; No carotid bruits CARDIAC: RRR, no murmurs, rubs, gallops RESPIRATORY:  Clear to auscultation without rales, wheezing or rhonchi  ABDOMEN: Soft, non-tender, non-distended EXTREMITIES:  No edema; No acute deformity     Assessment and Plan:  Coronary artery disease CT cardiac scoring 06/27/2021 showed coronary Score 274 (88th percentile) - Today patient is without any anginal symptoms, no indication for further ischemic evaluation at this time - She continues to maintain an active lifestyle and a pescatarian diet - Continue Zetia  10 mg daily and rosuvastatin  40 mg daily  Hyperlipidemia LDL 65, HDL 69, TC 147, TG 61 on 03/2023 LDL is under excellent control and under her goal of less than 70 - Continue Zetia  10 mg rosuvastatin  4 mg daily - Continue with moderate aerobic physical exercise and heart healthy dieting  Hypertension Blood pressure today is 144/80 and repeat 136/76 - Her blood pressure today is over her goal of less than 130/80 - Through chart review of her past several office visits with her PCP her blood pressure has been under adequate control.  She has also not taken her medication this morning - She will begin monitoring BP at home and bring in blood pressure log to PCP on her next visit for review - Blood pressure does remain above goal, losartan  could be increased at that time - Continue losartan  50 mg daily  Peripheral vascular disease VAS US  ABI  on 01/29/2022 showed resting left ankle-brachial index indicates mild left lower extremity arterial disease - She denies any symptoms of claudication and maintains an active lifestyle - Continue with adequate blood pressure and lipid control  Sinus bradycardia Her EKG today shows sinus bradycardia with heart rate 47 bpm.  Her heart rate has been similar over her last several office visits.  Looks like she has had heart rates between 40s-50s for several years.  I suspect her low heart rate is in response to high activity level. - She denies any syncope, near syncope, exercise intolerance - Avoid AV nodal blocking agents in the future - Continue to clinically monitor      Dispo:  Return in about 1 year (around 08/13/2024).  Signed, Dixon LITTIE Rana, NP

## 2023-08-26 ENCOUNTER — Ambulatory Visit: Payer: Self-pay

## 2023-08-26 NOTE — Telephone Encounter (Signed)
 Needs an appt

## 2023-08-26 NOTE — Telephone Encounter (Signed)
 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 Patient states she did not have an injuries but she is sore today. Patient reports 7/10 back pain and tylenol  is not improving her symptoms. Care advice was given and no appointments are available in the office tomorrow. Patient is going to the beach tomorrow and is request medication be sent to her pharmacy before her trip [facility] . Advised urgent care if needed. Patient verbalized understanding. Copied from CRM [id number]. Topic: Clinical - Red Word Triage >> Aug 26, 2023  4:28 PM Donald Frost wrote: Red Word that prompted transfer to Nurse Triage: The patient called in stating she fell yesterday after getting a haircut. She said her foot got caught up and she fell landing on her side. She was ok at first but feeling it pretty good today. She has called in to see if she could get something for pain but I told her I would need to get her triaged and she she said ok she understands. I will transfer her to E2C2 NT Reason for Disposition  [1] Recent fall AND [2] no injury  Answer Assessment - Initial Assessment Questions 1. 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 DOMESTIC VIOLENCE AND ELDER ABUSE SCREENING: "Did you fall because someone pushed you or tried to hurt you?" If Yes, ask: "Are you safe now?"     no 3. ONSET: "When did the fall happen?" (e.g., minutes, hours, or days ago)     Yesterday  4. LOCATION: "What part of the body hit the ground?" (e.g.,  back, buttocks, head, hips, knees, hands, head, stomach)     Right  5. INJURY: "Did you hurt (injure) yourself when you fell?" If Yes, ask: "What did you injure? Tell me more about this?" (e.g., body area; type of injury; pain severity)"     Yes body ache 6. PAIN: "Is there any pain?" If Yes, ask: "How bad is the pain?" (e.g., Scale 1-10; or mild,  moderate, severe)   - NONE (0): No pain   - MILD (1-3): Doesn't interfere with normal activities    - MODERATE (4-7): Interferes with normal activities or awakens from sleep    - SEVERE (8-10): Excruciating pain, unable to do any normal activities      7/10 7. SIZE: For cuts, bruises, or swelling, ask: "How large is it?" (e.g., inches or centimeters)      None 8. PREGNANCY: "Is there any chance you are pregnant?" "When was your last menstrual period?"     N/A  9. OTHER SYMPTOMS: "Do you have any other symptoms?" (e.g., dizziness, fever, weakness; new onset or worsening).      No 10. CAUSE: "What do you think caused the fall (or falling)?" (e.g., tripped, dizzy spell)       I tripped over my feet  Protocols used: [facility]

## 2023-08-27 ENCOUNTER — Ambulatory Visit: Admitting: Family Medicine

## 2023-08-27 ENCOUNTER — Encounter: Payer: Self-pay | Admitting: Family Medicine

## 2023-08-27 VITALS — BP 112/80 | HR 64 | Wt 133.0 lb

## 2023-08-27 DIAGNOSIS — M25551 Pain in right hip: Secondary | ICD-10-CM | POA: Diagnosis not present

## 2023-08-27 MED ORDER — MELOXICAM 15 MG PO TBDP: 1.0000 | ORAL_TABLET | Freq: Every day | ORAL | 1 refills | Status: AC

## 2023-08-27 NOTE — Progress Notes (Signed)
   Subjective:    Patient ID: [name] [name], female    DOB: 09/30/1950, 73 y.o.   MRN: [id number]  HPI She states that 2 days ago she accidentally fell and did slightly injure her right hip and knee.  The knee is doing better however the hip is slightly sore.  She is being followed by [facility] orthopedic and had an epidural injection.  She is waiting to have another 1.  She states that she is roughly 85% better.  She would like a refill on her meloxicam  which has helped in the past.   Review of Systems     Objective:    Physical Exam Slight tenderness palpation over the right hip area otherwise exam is normal.       Assessment & Plan:  Pain of right hip - Plan: Meloxicam  15 MG TBDP

## 2023-08-27 NOTE — Telephone Encounter (Signed)
 Appointment scheduled.

## 2023-09-03 ENCOUNTER — Encounter: Payer: Self-pay | Admitting: Family Medicine

## 2023-09-03 ENCOUNTER — Ambulatory Visit: Admitting: Family Medicine

## 2023-09-03 VITALS — BP 140/80 | HR 95 | Wt 132.6 lb

## 2023-09-03 DIAGNOSIS — F439 Reaction to severe stress, unspecified: Secondary | ICD-10-CM

## 2023-09-03 NOTE — Progress Notes (Signed)
   Subjective:    Patient ID: [name] [name], female    DOB: 1950/06/21, 73 y.o.   MRN: [id number]  HPI She is here for consult concerning possible cognitive dysfunction.  She states that she does not recognize anything in particular and thinks that her memory is actually fairly good.  Her husband does have some concerns over that and they both have been tested to see where they stand up cognitively.   Review of Systems     Objective:    Physical Exam Alert and in no distress.  MMSE of 28       Assessment & Plan:  Stress I discussed cognitive dysfunction with her in regard to both she and her husband keeping track of each other as to memory issues especially short-term memory and also possibly involving other people in this to see if they see any changes in it we will change accordingly.  Explained that there is minimal cognitive dysfunction that can occur with age that does get worse with time.  Discussed the possible use of medications down the road but certainly not at the present time.

## 2023-09-24 ENCOUNTER — Other Ambulatory Visit: Payer: Self-pay | Admitting: Cardiology

## 2023-09-24 DIAGNOSIS — E785 Hyperlipidemia, unspecified: Secondary | ICD-10-CM

## 2023-09-24 DIAGNOSIS — R030 Elevated blood-pressure reading, without diagnosis of hypertension: Secondary | ICD-10-CM

## 2023-09-24 MED ORDER — EZETIMIBE 10 MG PO TABS: 10.0000 mg | ORAL_TABLET | Freq: Every day | ORAL | 3 refills | Status: AC

## 2024-03-15 ENCOUNTER — Encounter: Payer: Self-pay | Admitting: Family Medicine

## 2024-03-15 ENCOUNTER — Ambulatory Visit (INDEPENDENT_AMBULATORY_CARE_PROVIDER_SITE_OTHER): Admitting: Family Medicine

## 2024-03-15 ENCOUNTER — Telehealth: Payer: Self-pay | Admitting: *Deleted

## 2024-03-15 VITALS — BP 128/78 | HR 60 | Ht 61.5 in | Wt 131.4 lb

## 2024-03-15 DIAGNOSIS — G8929 Other chronic pain: Secondary | ICD-10-CM

## 2024-03-15 DIAGNOSIS — M545 Low back pain, unspecified: Secondary | ICD-10-CM | POA: Diagnosis not present

## 2024-03-15 LAB — HM MAMMOGRAPHY

## 2024-03-15 NOTE — Progress Notes (Unsigned)
   Subjective:    Patient ID: [name] [name], female    DOB: Mar 20, 1951, 74 y.o.   MRN: [id number]  Discussed the use of AI scribe software for clinical note transcription with the patient, who gave verbal consent to proceed.  History of Present Illness   [name] [name] is a 73 year old female who presents with chronic lower back pain.  She has been experiencing chronic lower back pain for approximately a year, primarily located in the middle of her back, slightly to the left, and occasionally radiating down her leg. Initially, she underwent x-rays and likely an MRI, followed by an epidural injection at the L4-L5 region, which provided significant relief for about a year. However, the pain is now returning, though not as severe as before.  She attempted to schedule another appointment for a second injection but faced difficulties with the office staff. She maintains physical therapy exercises on her own after initially completing a recommended course of physical therapy.  She experiences some pain radiating down her leg but denies any significant weakness or foot drop.           Review of Systems     Objective:    Physical Exam Normal lumbar curve with no palpable tenderness to palpation over the LS spine.  Normal hip motion.  Negative straight leg raising.  Normal strength and DTRs.          Assessment & Plan:     Chronic low back pain with left-sided lumbar radiculopathy Intermittent lower back pain with left leg radiation. Previous L4-L5 epidural steroid injection provided relief. Symptoms recurring but less severe. No significant weakness or foot drop. Follow-up with orthopedics hindered by insurance and communication issues. - Referred to [facility] for physical therapy. - Advised to contact Dr. Gailen office if physical therapy is ineffective. - Instructed to call if issues arise scheduling with Dr. Cyrena.

## 2024-03-15 NOTE — Telephone Encounter (Signed)
 Copied from CRM #[id number]. Topic: General - Other >> Mar 15, 2024  4:33 PM Joesph B wrote: Reason for CRM: patient calling to see if she left her [employer] blue puffer jacket in the room at her appt. Called cal to confirm. I was advised that its not there. Patient would like for someone to call her if found.  Will do.

## 2024-04-05 ENCOUNTER — Encounter

## 2024-04-22 ENCOUNTER — Encounter: Admitting: Family Medicine

## 2024-04-26 ENCOUNTER — Encounter: Payer: Self-pay | Admitting: Family Medicine

## 2024-04-26 ENCOUNTER — Encounter: Payer: Medicare Other | Admitting: Family Medicine

## 2024-05-13 ENCOUNTER — Telehealth: Payer: Self-pay

## 2024-05-13 NOTE — Telephone Encounter (Signed)
 Copied from CRM #[id number]. Topic: General - Other >> May 13, 2024  1:12 PM Delon HERO wrote: Reason for CRM: Patient is calling to request a copy of her medicare card. Patient will pick up the card this afternoon. Please advise

## 2024-05-18 ENCOUNTER — Other Ambulatory Visit: Payer: Self-pay | Admitting: Family Medicine

## 2024-05-18 DIAGNOSIS — M25551 Pain in right hip: Secondary | ICD-10-CM

## 2024-05-19 ENCOUNTER — Other Ambulatory Visit: Payer: Self-pay

## 2024-05-19 DIAGNOSIS — R030 Elevated blood-pressure reading, without diagnosis of hypertension: Secondary | ICD-10-CM

## 2024-05-25 NOTE — Therapy (Incomplete)
 " OUTPATIENT PHYSICAL THERAPY THORACOLUMBAR EVALUATION   Patient Name: [name] [name] MRN: [id number] DOB:[birth year], 74 y.o., female Today's Date: 05/25/2024  END OF SESSION:   Past Medical History:  Diagnosis Date   Coronary artery disease involving native coronary artery of native heart without angina pectoris 01/14/2022   CAC SCORE 06/27/21  IMPRESSION:  Coronary calcium  score 274 is at the 88th percentile for the  patient's age, sex and race.   Glaucoma    Hyperlipidemia    Osteopenia    Past Surgical History:  Procedure Laterality Date   Cornea surgery     PATELLA FRACTURE SURGERY     Patient Active Problem List   Diagnosis Date Noted   Osteopenia 06/02/2022   Sinus bradycardia 01/15/2022   Coronary artery disease involving native coronary artery of native heart without angina pectoris 01/14/2022   Hyperlipidemia with target LDL less than 100 03/10/2011   Family history of heart disease in female family member before age 94 03/10/2011    PCP: Joyce Norleen BROCKS, MD   REFERRING PROVIDER: Joyce Norleen BROCKS, MD   REFERRING DIAG: [phone] (ICD-10-CM) - Chronic left-sided low back pain without sciatica   Rationale for Evaluation and Treatment: Rehabilitation  THERAPY DIAG:  No diagnosis found.  ONSET DATE: ***  SUBJECTIVE:                                                                                                                                                                                           SUBJECTIVE STATEMENT: ***  PERTINENT HISTORY:  ***  PAIN:  Are you having pain? Yes: {yespain:27235::NPRS scale: ***,Pain location: ***,Pain description: ***,Aggravating factors: ***,Relieving factors: ***}  PRECAUTIONS: {Therapy precautions:24002}  RED FLAGS: {PT Red Flags:29287}   WEIGHT BEARING RESTRICTIONS: {Yes ***/No:24003}  FALLS:  Has patient fallen in last 6 months? {fallsyesno:27318}  LIVING ENVIRONMENT: Lives with: {OPRC lives  with:25569::lives with their family} Lives in: {Lives in:25570} Stairs: {opstairs:27293} Has following equipment at home: {Assistive devices:23999}  OCCUPATION: ***  PLOF: {PLOF:24004}  PATIENT GOALS: ***  NEXT MD VISIT: ***  OBJECTIVE:  Note: Objective measures were completed at Evaluation unless otherwise noted.  DIAGNOSTIC FINDINGS:  No imaging in Epic for lumbar spine  PATIENT SURVEYS:  {rehab surveys:24030}  COGNITION: Overall cognitive status: {cognition:24006}     SENSATION: {sensation:27233}  MUSCLE LENGTH: Hamstrings: Right *** deg; Left *** deg Debby test: Right *** deg; Left *** deg  POSTURE: {posture:25561}  PALPATION: ***  LUMBAR ROM:   AROM eval  Flexion   Extension   Right lateral flexion   Left lateral flexion   Right rotation   Left rotation    (  Blank rows = not tested)  LOWER EXTREMITY ROM:     {AROM/PROM:27142}  Right eval Left eval  Hip flexion    Hip extension    Hip abduction    Hip adduction    Hip internal rotation    Hip external rotation    Knee flexion    Knee extension    Ankle dorsiflexion    Ankle plantarflexion    Ankle inversion    Ankle eversion     (Blank rows = not tested)  LOWER EXTREMITY MMT:    MMT Right eval Left eval  Hip flexion    Hip extension    Hip abduction    Hip adduction    Hip internal rotation    Hip external rotation    Knee flexion    Knee extension    Ankle dorsiflexion    Ankle plantarflexion    Ankle inversion    Ankle eversion     (Blank rows = not tested)  LUMBAR SPECIAL TESTS:  {lumbar special test:25242}  FUNCTIONAL TESTS:  {Functional tests:24029}  GAIT: Distance walked: *** Assistive device utilized: {Assistive devices:23999} Level of assistance: {Levels of assistance:24026} Comments: ***  TREATMENT DATE: ***                                                                                                                                 PATIENT  EDUCATION:  Education details: *** Person educated: {Person educated:25204} Education method: {Education Method:25205} Education comprehension: {Education Comprehension:25206}  HOME EXERCISE PROGRAM: ***  ASSESSMENT:  CLINICAL IMPRESSION: Patient is a 74 y.o. female who was seen today for physical therapy evaluation and treatment for M54.50,G89.29 (ICD-10-CM) - Chronic left-sided low back pain without sciatica.   OBJECTIVE IMPAIRMENTS: {opptimpairments:25111}.   ACTIVITY LIMITATIONS: {activitylimitations:27494}  PARTICIPATION LIMITATIONS: {participationrestrictions:25113}  PERSONAL FACTORS: {Personal factors:25162} are also affecting patient's functional outcome.   REHAB POTENTIAL: {rehabpotential:25112}  CLINICAL DECISION MAKING: {clinical decision making:25114}  EVALUATION COMPLEXITY: {Evaluation complexity:25115}   GOALS: Goals reviewed with patient? {yes/no:20286}  SHORT TERM GOALS: Target date: ***  Pt will be Ind in an initial HEP  Baseline: started Goal status: INITIAL  2.  *** Baseline:  Goal status: INITIAL  3.  *** Baseline:  Goal status: INITIAL  4.  *** Baseline:  Goal status: INITIAL  5.  *** Baseline:  Goal status: INITIAL  6.  *** Baseline:  Goal status: INITIAL  LONG TERM GOALS: Target date: ***  Pt will be Ind in a final HEP to maintain achieved LOF Baseline: started Goal status: INITIAL  2.  *** Baseline:  Goal status: INITIAL  3.  *** Baseline:  Goal status: INITIAL  4.  *** Baseline:  Goal status: INITIAL  5.  *** Baseline:  Goal status: INITIAL  6.  *** Baseline:  Goal status: INITIAL  PLAN:  PT FREQUENCY: {rehab frequency:25116}  PT DURATION: {rehab duration:25117}  PLANNED INTERVENTIONS: {rehab planned interventions:25118::97110-Therapeutic exercises,97530- Therapeutic [phone]- Neuromuscular re-education,97535- Self  Rjmz,02859- Manual therapy,Patient/Family education}.  PLAN FOR NEXT  SESSION: Review FOTO; assess response to HEP; progress therex as indicated; use of modalities, manual therapy; and TPDN as indicated.    Dasie Daft, PT 05/25/2024, 12:48 PM  "

## 2024-05-26 ENCOUNTER — Ambulatory Visit

## 2024-05-26 MED ORDER — ROSUVASTATIN CALCIUM 40 MG PO TABS: 40.0000 mg | ORAL_TABLET | Freq: Every day | ORAL | 0 refills | Status: AC

## 2024-05-26 NOTE — Telephone Encounter (Signed)
 In accordance with refill protocols, please review and address the following requirements before this medication refill can be authorized:  Labs  Labs needed to be done within 365 days

## 2024-05-30 MED ORDER — LOSARTAN POTASSIUM 50 MG PO TABS: 50.0000 mg | ORAL_TABLET | Freq: Every day | ORAL | 1 refills | Status: AC
